# Patient Record
Sex: Male | Born: 1985 | Race: White | Hispanic: No | Marital: Single | Smoking: Current every day smoker
Health system: Southern US, Community
[De-identification: ages and names within clinical notes are randomized; demographics above are authoritative.]

## PROBLEM LIST (undated history)

## (undated) DIAGNOSIS — N200 Calculus of kidney: Secondary | ICD-10-CM

## (undated) DIAGNOSIS — F419 Anxiety disorder, unspecified: Secondary | ICD-10-CM

## (undated) DIAGNOSIS — I10 Essential (primary) hypertension: Secondary | ICD-10-CM

## (undated) DIAGNOSIS — F1010 Alcohol abuse, uncomplicated: Secondary | ICD-10-CM

## (undated) HISTORY — PX: TONSILLECTOMY: SUR1361

---
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
## 2006-02-18 ENCOUNTER — Emergency Department (HOSPITAL_COMMUNITY): Admission: EM | Admit: 2006-02-18 | Discharge: 2006-02-18 | Payer: Self-pay | Admitting: Emergency Medicine

## 2007-11-12 ENCOUNTER — Emergency Department (HOSPITAL_COMMUNITY): Admission: EM | Admit: 2007-11-12 | Discharge: 2007-11-12 | Payer: Self-pay | Admitting: Emergency Medicine

## 2013-04-30 ENCOUNTER — Emergency Department (HOSPITAL_BASED_OUTPATIENT_CLINIC_OR_DEPARTMENT_OTHER)
Admission: EM | Admit: 2013-04-30 | Discharge: 2013-04-30 | Disposition: A | Discharge status: Home / Self Care | Payer: Self-pay | Attending: Emergency Medicine | Admitting: Emergency Medicine

## 2013-04-30 ENCOUNTER — Emergency Department (HOSPITAL_BASED_OUTPATIENT_CLINIC_OR_DEPARTMENT_OTHER): Payer: Self-pay

## 2013-04-30 ENCOUNTER — Encounter (HOSPITAL_BASED_OUTPATIENT_CLINIC_OR_DEPARTMENT_OTHER): Payer: Self-pay | Admitting: Family Medicine

## 2013-04-30 DIAGNOSIS — N23 Unspecified renal colic: Secondary | ICD-10-CM | POA: Insufficient documentation

## 2013-04-30 DIAGNOSIS — R112 Nausea with vomiting, unspecified: Secondary | ICD-10-CM | POA: Insufficient documentation

## 2013-04-30 DIAGNOSIS — M549 Dorsalgia, unspecified: Secondary | ICD-10-CM | POA: Insufficient documentation

## 2013-04-30 DIAGNOSIS — N509 Disorder of male genital organs, unspecified: Secondary | ICD-10-CM | POA: Insufficient documentation

## 2013-04-30 DIAGNOSIS — F17200 Nicotine dependence, unspecified, uncomplicated: Secondary | ICD-10-CM | POA: Insufficient documentation

## 2013-04-30 LAB — CBC WITH DIFFERENTIAL/PLATELET
Band Neutrophils: 8 % (ref 0–10)
Basophils Absolute: 0 10*3/uL (ref 0.0–0.1)
Basophils Relative: 0 % (ref 0–1)
Eosinophils Absolute: 0 10*3/uL (ref 0.0–0.7)
Eosinophils Relative: 0 % (ref 0–5)
Hemoglobin: 17.9 g/dL — ABNORMAL HIGH (ref 13.0–17.0)
Lymphocytes Relative: 10 % — ABNORMAL LOW (ref 12–46)
Lymphs Abs: 2 10*3/uL (ref 0.7–4.0)
MCH: 29.6 pg (ref 26.0–34.0)
Metamyelocytes Relative: 1 %
Monocytes Absolute: 1 10*3/uL (ref 0.1–1.0)
Monocytes Relative: 5 % (ref 3–12)
Neutro Abs: 16.9 10*3/uL — ABNORMAL HIGH (ref 1.7–7.7)
RBC: 6.05 MIL/uL — ABNORMAL HIGH (ref 4.22–5.81)
RDW: 14.4 % (ref 11.5–15.5)
WBC: 19.9 10*3/uL — ABNORMAL HIGH (ref 4.0–10.5)

## 2013-04-30 LAB — URINALYSIS, ROUTINE W REFLEX MICROSCOPIC
Nitrite: NEGATIVE
Specific Gravity, Urine: 1.03 (ref 1.005–1.030)
pH: 8 (ref 5.0–8.0)

## 2013-04-30 LAB — BASIC METABOLIC PANEL
BUN: 13 mg/dL (ref 6–23)
Calcium: 10.7 mg/dL — ABNORMAL HIGH (ref 8.4–10.5)
Creatinine, Ser: 1.1 mg/dL (ref 0.50–1.35)
GFR calc non Af Amer: >90 mL/min (ref >90)
Potassium: 3.9 mEq/L (ref 3.5–5.1)

## 2013-04-30 LAB — URINE MICROSCOPIC-ADD ON

## 2013-04-30 MED ORDER — TAMSULOSIN HCL 0.4 MG PO CAPS: 0.4000 mg | ORAL_CAPSULE | Freq: Every day | ORAL | Status: DC

## 2013-04-30 MED ORDER — ONDANSETRON HCL 4 MG/2ML IJ SOLN
4.0000 mg | Freq: Once | INTRAMUSCULAR | Status: AC
  Administered 2013-04-30: 4 mg via INTRAVENOUS

## 2013-04-30 MED ORDER — KETOROLAC TROMETHAMINE 30 MG/ML IJ SOLN
30.0000 mg | Freq: Once | INTRAMUSCULAR | Status: AC
  Administered 2013-04-30: 30 mg via INTRAVENOUS

## 2013-04-30 MED ORDER — KETOROLAC TROMETHAMINE 30 MG/ML IJ SOLN
INTRAMUSCULAR | Status: AC
  Administered 2013-04-30: 30 mg via INTRAVENOUS
  Filled 2013-04-30: qty 1

## 2013-04-30 MED ORDER — MORPHINE SULFATE 4 MG/ML IJ SOLN
INTRAMUSCULAR | Status: AC
  Administered 2013-04-30: 4 mg via INTRAVENOUS
  Filled 2013-04-30: qty 1

## 2013-04-30 MED ORDER — MORPHINE SULFATE 4 MG/ML IJ SOLN
4.0000 mg | Freq: Once | INTRAMUSCULAR | Status: AC
  Administered 2013-04-30: 4 mg via INTRAVENOUS

## 2013-04-30 MED ORDER — OXYCODONE-ACETAMINOPHEN 7.5-325 MG PO TABS: 1.0000 | ORAL_TABLET | ORAL | Status: DC | PRN

## 2013-04-30 MED ORDER — MORPHINE SULFATE 4 MG/ML IJ SOLN
4.0000 mg | Freq: Once | INTRAMUSCULAR | Status: AC
  Administered 2013-04-30: 4 mg via INTRAVENOUS
  Filled 2013-04-30: qty 1

## 2013-04-30 MED ORDER — ONDANSETRON HCL 4 MG/2ML IJ SOLN
INTRAMUSCULAR | Status: AC
  Filled 2013-04-30: qty 2

## 2013-04-30 NOTE — ED Notes (Signed)
Pt does not have a PCP. 

## 2013-04-30 NOTE — ED Notes (Signed)
Pt c/o low abdominal pain and left flank pain, "scrotal area", n/v, diaphoresis.

## 2013-04-30 NOTE — ED Provider Notes (Signed)
CSN: [id number]     Arrival date & time 04/30/13  1036 History     First MD Initiated Contact with Patient 04/30/13 1103     Chief Complaint  Patient presents with  . Abdominal Pain  . Back Pain   (Consider location/radiation/quality/duration/timing/severity/associated sxs/prior Treatment) HPI Comments: Patient presents to the ER with complaints of left flank pain. Patient reports that he had onset of left-sided back pain last night that improved with massage. This morning, however, pain has become severe. Pain was only from the lower back down into the left groin area. Is expressing nausea and vomiting as well as wife secondary to pain. Patient has never had similar pain. Pain is severe and constant.  Patient is a 27 y.o. male presenting with abdominal pain and back pain.  Abdominal Pain Associated symptoms: nausea and vomiting   Associated symptoms: no hematuria   Back Pain Associated symptoms: abdominal pain     History reviewed. No pertinent past medical history. Past Surgical History  Procedure Laterality Date  . Tonsillectomy     No family history on file. History  Substance Use Topics  . Smoking status: Current Every Day Smoker  . Smokeless tobacco: Not on file  . Alcohol Use: Yes    Review of Systems  Gastrointestinal: Positive for nausea, vomiting and abdominal pain.  Genitourinary: Positive for testicular pain. Negative for hematuria, discharge and penile pain.  Musculoskeletal: Positive for back pain.  All other systems reviewed and are negative.    Allergies  Review of patient's allergies indicates no known allergies.  Home Medications  No current outpatient prescriptions on file. BP 142/70  Pulse 54  Temp(Src) 98.6 F (37 C) (Oral)  Resp 22  Ht 5\' 9"  (1.753 m)  Wt 193 lb (87.544 kg)  BMI 28.49 kg/m2  SpO2 100% Physical Exam  Constitutional: He is oriented to person, place, and time. He appears well-developed and well-nourished. He appears  distressed.  HENT:  Head: Normocephalic and atraumatic.  Right Ear: Hearing normal.  Left Ear: Hearing normal.  Nose: Nose normal.  Mouth/Throat: Oropharynx is clear and moist and mucous membranes are normal.  Eyes: Conjunctivae and EOM are normal. Pupils are equal, round, and reactive to light.  Neck: Normal range of motion. Neck supple.  Cardiovascular: Regular rhythm, S1 normal and S2 normal.  Exam reveals no gallop and no friction rub.   No murmur heard. Pulmonary/Chest: Effort normal and breath sounds normal. No respiratory distress. He exhibits no tenderness.  Abdominal: Soft. Normal appearance and bowel sounds are normal. There is no hepatosplenomegaly. There is no tenderness. There is no rebound, no guarding, no tenderness at McBurney's point and negative Murphy's sign. No hernia.  Musculoskeletal: Normal range of motion.  Neurological: He is alert and oriented to person, place, and time. He has normal strength. No cranial nerve deficit or sensory deficit. Coordination normal. GCS eye subscore is 4. GCS verbal subscore is 5. GCS motor subscore is 6.  Skin: Skin is warm, dry and intact. No rash noted. No cyanosis.  Psychiatric: He has a normal mood and affect. His speech is normal and behavior is normal. Thought content normal.    ED Course   Procedures (including critical care time)  Labs Reviewed  CBC WITH DIFFERENTIAL - Abnormal; Notable for the following:    WBC 19.9 (*)    RBC 6.05 (*)    Hemoglobin 17.9 (*)    HCT 52.4 (*)    Lymphocytes Relative 10 (*)    Neutro  Abs 16.9 (*)    All other components within normal limits  BASIC METABOLIC PANEL - Abnormal; Notable for the following:    Glucose, Bld 147 (*)    Calcium 10.7 (*)    All other components within normal limits  URINALYSIS, ROUTINE W REFLEX MICROSCOPIC   Ct Abdomen Pelvis Wo Contrast  04/30/2013   *RADIOLOGY REPORT*  Clinical Data: Left flank pain  CT ABDOMEN AND PELVIS WITHOUT CONTRAST  Technique:   Multidetector CT imaging of the abdomen and pelvis was performed following the standard protocol without intravenous contrast.  Comparison: None  Findings: Mild obstruction of the left kidney with mild hydronephrosis and mild renal enlargement.  There is a 2 mm stone in the mid-left ureter at the L3-4 level.  No other renal calculi identified.  The right kidney is normal.  Lung bases are clear.  Fatty infiltration of the liver without focal liver lesion.  Gallbladder is negative.  Pancreas and spleen are negative.  Negative for bowel obstruction or bowel thickening.  No free fluid or mass.  IMPRESSION: 2 mm partially obstructing stone mid-left ureter at the L3-4 level.   Original Report Authenticated By: Janeece Riggers, M.D.   Diagnosis: Ureterolithiasis  MDM  Patient presents with complaints of left flank pain radiating into the groin. His examination revealed severe distress, but examination of his abdomen and testicles was essentially normal. Presentation examination and raise concern for renal colic. CT scan was performed to identify a cause, including rule out diverticulitis. CAT scan that showed a partially obstructing mid ureter 2 mm to which explains the patient's symptoms. Patient given analgesia. Will followup with urology.  Gilda Crease, MD 04/30/13 [phone]

## 2013-05-12 ENCOUNTER — Emergency Department (HOSPITAL_BASED_OUTPATIENT_CLINIC_OR_DEPARTMENT_OTHER)
Admission: EM | Admit: 2013-05-12 | Discharge: 2013-05-12 | Disposition: A | Discharge status: Home / Self Care | Payer: Self-pay | Attending: Emergency Medicine | Admitting: Emergency Medicine

## 2013-05-12 ENCOUNTER — Encounter (HOSPITAL_BASED_OUTPATIENT_CLINIC_OR_DEPARTMENT_OTHER): Payer: Self-pay | Admitting: *Deleted

## 2013-05-12 DIAGNOSIS — F17200 Nicotine dependence, unspecified, uncomplicated: Secondary | ICD-10-CM | POA: Insufficient documentation

## 2013-05-12 DIAGNOSIS — N23 Unspecified renal colic: Secondary | ICD-10-CM | POA: Insufficient documentation

## 2013-05-12 DIAGNOSIS — Z87442 Personal history of urinary calculi: Secondary | ICD-10-CM | POA: Insufficient documentation

## 2013-05-12 DIAGNOSIS — R6883 Chills (without fever): Secondary | ICD-10-CM | POA: Insufficient documentation

## 2013-05-12 DIAGNOSIS — Z79899 Other long term (current) drug therapy: Secondary | ICD-10-CM | POA: Insufficient documentation

## 2013-05-12 HISTORY — DX: Calculus of kidney: N20.0

## 2013-05-12 LAB — URINE MICROSCOPIC-ADD ON

## 2013-05-12 LAB — URINALYSIS, ROUTINE W REFLEX MICROSCOPIC
Glucose, UA: NEGATIVE mg/dL
Ketones, ur: NEGATIVE mg/dL
Leukocytes, UA: NEGATIVE
Nitrite: NEGATIVE
Specific Gravity, Urine: 1.025 (ref 1.005–1.030)
Urobilinogen, UA: 0.2 mg/dL (ref 0.0–1.0)

## 2013-05-12 MED ORDER — TAMSULOSIN HCL 0.4 MG PO CAPS
0.4000 mg | ORAL_CAPSULE | ORAL | Status: AC
  Administered 2013-05-12: 0.4 mg via ORAL
  Filled 2013-05-12: qty 1

## 2013-05-12 MED ORDER — NAPROXEN SODIUM 220 MG PO TABS: ORAL_TABLET | ORAL | Status: DC

## 2013-05-12 MED ORDER — TAMSULOSIN HCL 0.4 MG PO CAPS: ORAL_CAPSULE | ORAL | Status: DC

## 2013-05-12 MED ORDER — OXYCODONE-ACETAMINOPHEN 7.5-325 MG PO TABS: 1.0000 | ORAL_TABLET | ORAL | Status: DC | PRN

## 2013-05-12 MED ORDER — KETOROLAC TROMETHAMINE 60 MG/2ML IM SOLN
60.0000 mg | Freq: Once | INTRAMUSCULAR | Status: AC
  Administered 2013-05-12: 60 mg via INTRAMUSCULAR
  Filled 2013-05-12: qty 2

## 2013-05-12 MED ORDER — TAMSULOSIN HCL 0.4 MG PO CAPS: 0.4000 mg | ORAL_CAPSULE | Freq: Every day | ORAL | Status: DC

## 2013-05-12 NOTE — ED Notes (Signed)
MD at bedside. 

## 2013-05-12 NOTE — ED Provider Notes (Signed)
CSN: [id number]     Arrival date & time 05/12/13  3086 History     First MD Initiated Contact with Patient 05/12/13 0540     Chief Complaint  Patient presents with  . Flank Pain   (Consider location/radiation/quality/duration/timing/severity/associated sxs/prior Treatment) HPI This is a 27 year old male who was seen here on the seventh of this month and diagnosed with a ureteral stone. He was advised that the stone would pass in about 2 days, and he was given prescriptions for 20 Percocets and 5 Flomax capsules. He states the pain returned to 3 days ago and became moderate to severe this morning. The pain is previously primarily located in the left flank but is now more intense in the left groin and less intense in the left flank. He's had some chills but no fever. He denies nausea or vomiting. He has not noted any hematuria but has had difficulty urinating. He is out of his medications. The pain is not worsened with movement nor relieved with rest.  Past Medical History  Diagnosis Date  . Kidney stone    Past Surgical History  Procedure Laterality Date  . Tonsillectomy     History reviewed. No pertinent family history. History  Substance Use Topics  . Smoking status: Current Every Day Smoker  . Smokeless tobacco: Not on file  . Alcohol Use: Yes    Review of Systems  All other systems reviewed and are negative.    Allergies  Review of patient's allergies indicates no known allergies.  Home Medications   Current Outpatient Rx  Name  Route  Sig  Dispense  Refill  . oxyCODONE-acetaminophen (PERCOCET) 7.5-325 MG per tablet   Oral   Take 1 tablet by mouth every 4 (four) hours as needed for pain.   20 tablet   0   . tamsulosin (FLOMAX) 0.4 MG CAPS capsule   Oral   Take 1 capsule (0.4 mg total) by mouth daily.   5 capsule   0    BP 142/72  Pulse 80  Temp(Src) 98.4 F (36.9 C) (Oral)  Resp 18  Ht 5\' 9"  (1.753 m)  Wt 200 lb (90.719 kg)  BMI 29.52 kg/m2  SpO2  99%  Physical Exam General: Well-developed, well-nourished male in no acute distress; appearance consistent with age of record HENT: normocephalic, atraumatic Eyes: pupils equal round and reactive to light; extraocular muscles intact Neck: supple Heart: regular rate and rhythm; no murmurs, rubs or gallops Lungs: clear to auscultation bilaterally Abdomen: soft; nondistended; nontender; no masses or hepatosplenomegaly; bowel sounds present GU: Mild left CVA tenderness; urine cloudy and slightly blood-tinged Extremities: No deformity; full range of motion Neurologic: Awake, alert and oriented; motor function intact in all extremities and symmetric; no facial droop Skin: Warm and dry Psychiatric: Normal mood and affect    ED Course   Procedures (including critical care time)    MDM   Nursing notes and vitals signs, including pulse oximetry, reviewed.  Summary of this visit's results, reviewed by myself:  Labs:  Results for orders placed during the hospital encounter of 05/12/13 (from the past 24 hour(s))  URINALYSIS, ROUTINE W REFLEX MICROSCOPIC     Status: Abnormal   Collection Time    05/12/13  5:44 AM      Result Value Range   Color, Urine YELLOW  YELLOW   APPearance CLOUDY (*) CLEAR   Specific Gravity, Urine 1.025  1.005 - 1.030   pH 5.5  5.0 - 8.0   Glucose, UA  NEGATIVE  NEGATIVE mg/dL   Hgb urine dipstick LARGE (*) NEGATIVE   Bilirubin Urine NEGATIVE  NEGATIVE   Ketones, ur NEGATIVE  NEGATIVE mg/dL   Protein, ur NEGATIVE  NEGATIVE mg/dL   Urobilinogen, UA 0.2  0.0 - 1.0 mg/dL   Nitrite NEGATIVE  NEGATIVE   Leukocytes, UA NEGATIVE  NEGATIVE  URINE MICROSCOPIC-ADD ON     Status: Abnormal   Collection Time    05/12/13  5:44 AM      Result Value Range   Squamous Epithelial / LPF RARE  RARE   WBC, UA 0-2  <3 WBC/hpf   RBC / HPF TOO NUMEROUS TO COUNT  <3 RBC/hpf   Bacteria, UA RARE  RARE   Crystals CA OXALATE CRYSTALS (*) NEGATIVE   Urine-Other MUCOUS PRESENT         Hanley Seamen, MD 05/12/13 [phone]

## 2013-05-12 NOTE — ED Notes (Signed)
Pt here c/o left flank pain and groin pain, dx'd with kidney stone 2 weeks ago

## 2013-09-28 ENCOUNTER — Encounter (HOSPITAL_BASED_OUTPATIENT_CLINIC_OR_DEPARTMENT_OTHER): Payer: Self-pay | Admitting: Emergency Medicine

## 2013-09-28 ENCOUNTER — Emergency Department (HOSPITAL_BASED_OUTPATIENT_CLINIC_OR_DEPARTMENT_OTHER)
Admission: EM | Admit: 2013-09-28 | Discharge: 2013-09-28 | Disposition: A | Discharge status: Home / Self Care | Payer: No Typology Code available for payment source | Attending: Emergency Medicine | Admitting: Emergency Medicine

## 2013-09-28 DIAGNOSIS — J029 Acute pharyngitis, unspecified: Secondary | ICD-10-CM | POA: Insufficient documentation

## 2013-09-28 DIAGNOSIS — F17200 Nicotine dependence, unspecified, uncomplicated: Secondary | ICD-10-CM | POA: Insufficient documentation

## 2013-09-28 DIAGNOSIS — J329 Chronic sinusitis, unspecified: Secondary | ICD-10-CM | POA: Insufficient documentation

## 2013-09-28 DIAGNOSIS — Z87442 Personal history of urinary calculi: Secondary | ICD-10-CM | POA: Insufficient documentation

## 2013-09-28 NOTE — ED Provider Notes (Signed)
CSN: [id number]     Arrival date & time 09/28/13  1401 History   First MD Initiated Contact with Patient 09/28/13 1427     Chief Complaint  Patient presents with  . URI    Patient is a 28 y.o. male presenting with URI.  URI Presenting symptoms: congestion, cough, ear pain and sore throat   Severity:  Mild Onset quality:  Gradual Duration:  3 days Timing:  Intermittent Progression:  Worsening Chronicity:  New Worsened by:  Nothing tried Associated symptoms: sinus pain     Past Medical History  Diagnosis Date  . Kidney stone    Past Surgical History  Procedure Laterality Date  . Tonsillectomy     No family history on file. History  Substance Use Topics  . Smoking status: Current Every Day Smoker -- 1.00 packs/day    Types: Cigarettes  . Smokeless tobacco: Not on file  . Alcohol Use: Yes    Review of Systems  HENT: Positive for congestion, ear pain and sore throat.   Respiratory: Positive for cough.     Allergies  Review of patient's allergies indicates no known allergies.  Home Medications  No current outpatient prescriptions on file. BP 134/89  Pulse 85  Temp(Src) 98.6 F (37 C) (Oral)  Resp 18  Ht 5\' 9"  (1.753 m)  Wt 195 lb (88.451 kg)  BMI 28.78 kg/m2  SpO2 98% Physical Exam CONSTITUTIONAL: Well developed/well nourished HEAD: Normocephalic/atraumatic EYES: EOMI/PERRL ENMT: Mucous membranes moist, uvula midline, mild erythema to oropharynx, no exudates, nasal congestion noted. Bilateral TM without erythema noted NECK: supple no meningeal signs CV: S1/S2 noted, no murmurs/rubs/gallops noted LUNGS: Lungs are clear to auscultation bilaterally, no apparent distress ABDOMEN: soft, nontender, no rebound or guarding NEURO: Pt is awake/alert, moves all extremitiesx4 EXTREMITIES: pulses normal, full ROM SKIN: warm, color normal PSYCH: no abnormalities of mood noted  ED Course  Procedures (including critical care time) Labs Review Labs Reviewed - No data  to display Imaging Review No results found.  EKG Interpretation   None       MDM   1. Sinusitis    Nursing notes including past medical history and social history reviewed and considered in documentation  Suspect viral process causing symptoms.  Stable for d/c home    Sharyon Cable, MD 09/28/13 1512

## 2013-09-28 NOTE — ED Notes (Signed)
Sneezing, stuffy nose, sore throat and ear pain.

## 2013-09-28 NOTE — Discharge Instructions (Signed)
Antibiotic Nonuse   Your caregiver felt that the infection or problem was not one that would be helped with an antibiotic.  Infections may be caused by viruses or bacteria. Only a caregiver can tell which one of these is the likely cause of an illness. A cold is the most common cause of infection in both adults and children. A cold is a virus. Antibiotic treatment will have no effect on a viral infection. Viruses can lead to many lost days of work caring for sick children and many missed days of school. Children may catch as many as 10 "colds" or "flus" per year during which they can be tearful, cranky, and uncomfortable. The goal of treating a virus is aimed at keeping the ill person comfortable.  Antibiotics are medications used to help the body fight bacterial infections. There are relatively few types of bacteria that cause infections but there are hundreds of viruses. While both viruses and bacteria cause infection they are very different types of germs. A viral infection will typically go away by itself within 7 to 10 days. Bacterial infections may spread or get worse without antibiotic treatment.  Examples of bacterial infections are:   Sore throats (like strep throat or tonsillitis).   Infection in the lung (pneumonia).   Ear and skin infections.  Examples of viral infections are:   Colds or flus.   Most coughs and bronchitis.   Sore throats not caused by Strep.   Runny noses.  It is often best not to take an antibiotic when a viral infection is the cause of the problem. Antibiotics can kill off the helpful bacteria that we have inside our body and allow harmful bacteria to start growing. Antibiotics can cause side effects such as allergies, nausea, and diarrhea without helping to improve the symptoms of the viral infection. Additionally, repeated uses of antibiotics can cause bacteria inside of our body to become resistant. That resistance can be passed onto harmful bacterial. The next time you have  an infection it may be harder to treat if antibiotics are used when they are not needed. Not treating with antibiotics allows our own immune system to develop and take care of infections more efficiently. Also, antibiotics will work better for us when they are prescribed for bacterial infections.  Treatments for a child that is ill may include:   Give extra fluids throughout the day to stay hydrated.   Get plenty of rest.   Only give your child over-the-counter or prescription medicines for pain, discomfort, or fever as directed by your caregiver.   The use of a cool mist humidifier may help stuffy noses.   Cold medications if suggested by your caregiver.  Your caregiver may decide to start you on an antibiotic if:   The problem you were seen for today continues for a longer length of time than expected.   You develop a secondary bacterial infection.  SEEK MEDICAL CARE IF:   Fever lasts longer than 5 days.   Symptoms continue to get worse after 5 to 7 days or become severe.   Difficulty in breathing develops.   Signs of dehydration develop (poor drinking, rare urinating, dark colored urine).   Changes in behavior or worsening tiredness (listlessness or lethargy).  Document Released: 11/19/2001 Document Revised: 12/03/2011 Document Reviewed: 05/18/2009  [employer] Patient Information 2014 [employer]

## 2013-12-28 ENCOUNTER — Encounter (HOSPITAL_BASED_OUTPATIENT_CLINIC_OR_DEPARTMENT_OTHER): Payer: Self-pay | Admitting: Emergency Medicine

## 2013-12-28 ENCOUNTER — Emergency Department (HOSPITAL_BASED_OUTPATIENT_CLINIC_OR_DEPARTMENT_OTHER)
Admission: EM | Admit: 2013-12-28 | Discharge: 2013-12-28 | Discharge status: Against Medical Advice | Payer: No Typology Code available for payment source | Attending: Emergency Medicine | Admitting: Emergency Medicine

## 2013-12-28 ENCOUNTER — Emergency Department (HOSPITAL_BASED_OUTPATIENT_CLINIC_OR_DEPARTMENT_OTHER): Payer: No Typology Code available for payment source

## 2013-12-28 DIAGNOSIS — X58XXXA Exposure to other specified factors, initial encounter: Secondary | ICD-10-CM | POA: Insufficient documentation

## 2013-12-28 DIAGNOSIS — F17200 Nicotine dependence, unspecified, uncomplicated: Secondary | ICD-10-CM | POA: Insufficient documentation

## 2013-12-28 DIAGNOSIS — S99919A Unspecified injury of unspecified ankle, initial encounter: Principal | ICD-10-CM

## 2013-12-28 DIAGNOSIS — Y939 Activity, unspecified: Secondary | ICD-10-CM | POA: Insufficient documentation

## 2013-12-28 DIAGNOSIS — S8990XA Unspecified injury of unspecified lower leg, initial encounter: Secondary | ICD-10-CM | POA: Insufficient documentation

## 2013-12-28 DIAGNOSIS — Y929 Unspecified place or not applicable: Secondary | ICD-10-CM | POA: Insufficient documentation

## 2013-12-28 DIAGNOSIS — S99929A Unspecified injury of unspecified foot, initial encounter: Principal | ICD-10-CM
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
## 2019-11-22 ENCOUNTER — Encounter (HOSPITAL_COMMUNITY): Payer: Self-pay | Admitting: Emergency Medicine

## 2019-11-22 ENCOUNTER — Other Ambulatory Visit: Payer: Self-pay

## 2019-11-22 ENCOUNTER — Inpatient Hospital Stay (HOSPITAL_COMMUNITY)
Admission: EM | Admit: 2019-11-22 | Discharge: 2019-11-25 | DRG: 897 | Disposition: A | Discharge status: Home / Self Care | Payer: No Typology Code available for payment source | Attending: Internal Medicine | Admitting: Internal Medicine

## 2019-11-22 ENCOUNTER — Emergency Department (HOSPITAL_COMMUNITY): Payer: No Typology Code available for payment source

## 2019-11-22 DIAGNOSIS — Z818 Family history of other mental and behavioral disorders: Secondary | ICD-10-CM

## 2019-11-22 DIAGNOSIS — Z87442 Personal history of urinary calculi: Secondary | ICD-10-CM

## 2019-11-22 DIAGNOSIS — R7401 Elevation of levels of liver transaminase levels: Secondary | ICD-10-CM | POA: Diagnosis present

## 2019-11-22 DIAGNOSIS — K0889 Other specified disorders of teeth and supporting structures: Secondary | ICD-10-CM | POA: Diagnosis present

## 2019-11-22 DIAGNOSIS — F1010 Alcohol abuse, uncomplicated: Secondary | ICD-10-CM

## 2019-11-22 DIAGNOSIS — R569 Unspecified convulsions: Secondary | ICD-10-CM

## 2019-11-22 DIAGNOSIS — F329 Major depressive disorder, single episode, unspecified: Secondary | ICD-10-CM | POA: Diagnosis present

## 2019-11-22 DIAGNOSIS — Z560 Unemployment, unspecified: Secondary | ICD-10-CM

## 2019-11-22 DIAGNOSIS — I10 Essential (primary) hypertension: Secondary | ICD-10-CM

## 2019-11-22 DIAGNOSIS — F17210 Nicotine dependence, cigarettes, uncomplicated: Secondary | ICD-10-CM | POA: Diagnosis present

## 2019-11-22 DIAGNOSIS — F10939 Alcohol use, unspecified with withdrawal, unspecified: Secondary | ICD-10-CM | POA: Diagnosis present

## 2019-11-22 DIAGNOSIS — F10230 Alcohol dependence with withdrawal, uncomplicated: Secondary | ICD-10-CM

## 2019-11-22 DIAGNOSIS — F10239 Alcohol dependence with withdrawal, unspecified: Principal | ICD-10-CM

## 2019-11-22 DIAGNOSIS — Z79899 Other long term (current) drug therapy: Secondary | ICD-10-CM

## 2019-11-22 DIAGNOSIS — Z20822 Contact with and (suspected) exposure to covid-19: Secondary | ICD-10-CM | POA: Diagnosis present

## 2019-11-22 DIAGNOSIS — F419 Anxiety disorder, unspecified: Secondary | ICD-10-CM | POA: Diagnosis present

## 2019-11-22 DIAGNOSIS — R000 Tachycardia, unspecified: Secondary | ICD-10-CM | POA: Diagnosis present

## 2019-11-22 HISTORY — DX: Unspecified convulsions: R56.9

## 2019-11-22 HISTORY — DX: Alcohol abuse, uncomplicated: F10.10

## 2019-11-22 HISTORY — DX: Essential (primary) hypertension: I10

## 2019-11-22 LAB — CBC WITH DIFFERENTIAL/PLATELET
Abs Immature Granulocytes: 0.05 10*3/uL (ref 0.00–0.07)
Basophils Absolute: 0 10*3/uL (ref 0.0–0.1)
Basophils Relative: 0 %
Eosinophils Absolute: 0.4 10*3/uL (ref 0.0–0.5)
Eosinophils Relative: 3 %
HCT: 53.3 % — ABNORMAL HIGH (ref 39.0–52.0)
Hemoglobin: 18.2 g/dL — ABNORMAL HIGH (ref 13.0–17.0)
Immature Granulocytes: 0 %
Lymphocytes Relative: 6 %
Lymphs Abs: 0.9 10*3/uL (ref 0.7–4.0)
MCH: 33 pg (ref 26.0–34.0)
MCHC: 34.1 g/dL (ref 30.0–36.0)
MCV: 96.7 fL (ref 80.0–100.0)
Monocytes Absolute: 0.8 10*3/uL (ref 0.1–1.0)
Monocytes Relative: 5 %
Neutro Abs: 12 10*3/uL — ABNORMAL HIGH (ref 1.7–7.7)
Neutrophils Relative %: 86 %
Platelets: 192 10*3/uL (ref 150–400)
RBC: 5.51 MIL/uL (ref 4.22–5.81)
RDW: 15.9 % — ABNORMAL HIGH (ref 11.5–15.5)
WBC: 14.1 10*3/uL — ABNORMAL HIGH (ref 4.0–10.5)
nRBC: 0 % (ref 0.0–0.2)

## 2019-11-22 LAB — SALICYLATE LEVEL: Salicylate Lvl: <7 mg/dL — ABNORMAL LOW (ref 7.0–30.0)

## 2019-11-22 LAB — COMPREHENSIVE METABOLIC PANEL
ALT: 68 U/L — ABNORMAL HIGH (ref 0–44)
AST: 79 U/L — ABNORMAL HIGH (ref 15–41)
Albumin: 4.8 g/dL (ref 3.5–5.0)
Alkaline Phosphatase: 78 U/L (ref 38–126)
Anion gap: 13 (ref 5–15)
BUN: 6 mg/dL (ref 6–20)
CO2: 20 mmol/L — ABNORMAL LOW (ref 22–32)
Calcium: 9.1 mg/dL (ref 8.9–10.3)
Chloride: 99 mmol/L (ref 98–111)
Creatinine, Ser: 0.85 mg/dL (ref 0.61–1.24)
GFR calc Af Amer: >60 mL/min (ref >60)
GFR calc non Af Amer: >60 mL/min (ref >60)
Glucose, Bld: 191 mg/dL — ABNORMAL HIGH (ref 70–99)
Potassium: 3.8 mmol/L (ref 3.5–5.1)
Sodium: 132 mmol/L — ABNORMAL LOW (ref 135–145)
Total Bilirubin: 1.6 mg/dL — ABNORMAL HIGH (ref 0.3–1.2)
Total Protein: 8.2 g/dL — ABNORMAL HIGH (ref 6.5–8.1)

## 2019-11-22 LAB — CBG MONITORING, ED: Glucose-Capillary: 201 mg/dL — ABNORMAL HIGH (ref 70–99)

## 2019-11-22 LAB — ACETAMINOPHEN LEVEL: Acetaminophen (Tylenol), Serum: <10 ug/mL — ABNORMAL LOW (ref 10–30)

## 2019-11-22 LAB — ETHANOL: Alcohol, Ethyl (B): <10 mg/dL (ref <10)

## 2019-11-22 MED ORDER — ENOXAPARIN SODIUM 40 MG/0.4ML ~~LOC~~ SOLN
40.0000 mg | SUBCUTANEOUS | Status: DC
  Administered 2019-11-22 – 2019-11-24 (×3): 40 mg via SUBCUTANEOUS
  Filled 2019-11-22 (×3): qty 0.4

## 2019-11-22 MED ORDER — FOLIC ACID 5 MG/ML IJ SOLN
INTRAMUSCULAR | Status: AC
  Filled 2019-11-22: qty 0.2

## 2019-11-22 MED ORDER — ONDANSETRON HCL 4 MG PO TABS: 4.0000 mg | ORAL_TABLET | Freq: Three times a day (TID) | ORAL | Status: DC | PRN

## 2019-11-22 MED ORDER — POLYETHYLENE GLYCOL 3350 17 G PO PACK: 17.0000 g | PACK | Freq: Every day | ORAL | Status: DC | PRN

## 2019-11-22 MED ORDER — LORAZEPAM 2 MG/ML IJ SOLN
0.0000 mg | Freq: Four times a day (QID) | INTRAMUSCULAR | Status: AC
  Administered 2019-11-23: 06:00:00 1 mg via INTRAVENOUS
  Filled 2019-11-22 (×2): qty 1

## 2019-11-22 MED ORDER — LORAZEPAM 2 MG/ML IJ SOLN
2.0000 mg | Freq: Once | INTRAMUSCULAR | Status: AC
  Administered 2019-11-22: 22:00:00 2 mg via INTRAVENOUS

## 2019-11-22 MED ORDER — CLONIDINE HCL 0.1 MG PO TABS
0.1000 mg | ORAL_TABLET | Freq: Two times a day (BID) | ORAL | Status: DC
  Administered 2019-11-22: 0.1 mg via ORAL
  Filled 2019-11-22 (×2): qty 1

## 2019-11-22 MED ORDER — THIAMINE HCL 100 MG/ML IJ SOLN
Freq: Once | INTRAVENOUS | Status: AC
  Filled 2019-11-22: qty 1000

## 2019-11-22 MED ORDER — SODIUM CHLORIDE 0.9% FLUSH: 3.0000 mL | INTRAVENOUS | Status: DC | PRN

## 2019-11-22 MED ORDER — LORAZEPAM 1 MG PO TABS: 0.0000 mg | ORAL_TABLET | Freq: Two times a day (BID) | ORAL | Status: DC

## 2019-11-22 MED ORDER — ZOLPIDEM TARTRATE 5 MG PO TABS: 5.0000 mg | ORAL_TABLET | Freq: Every evening | ORAL | Status: DC | PRN

## 2019-11-22 MED ORDER — LORAZEPAM 2 MG/ML IJ SOLN
INTRAMUSCULAR | Status: AC
  Filled 2019-11-22: qty 1

## 2019-11-22 MED ORDER — M.V.I. ADULT IV INJ
INJECTION | INTRAVENOUS | Status: AC
  Filled 2019-11-22: qty 10

## 2019-11-22 MED ORDER — THIAMINE HCL 100 MG/ML IJ SOLN
100.0000 mg | Freq: Every day | INTRAMUSCULAR | Status: DC
  Administered 2019-11-22: 100 mg via INTRAVENOUS
  Filled 2019-11-22 (×2): qty 2

## 2019-11-22 MED ORDER — ALUM & MAG HYDROXIDE-SIMETH 200-200-20 MG/5ML PO SUSP: 30.0000 mL | Freq: Four times a day (QID) | ORAL | Status: DC | PRN

## 2019-11-22 MED ORDER — LORAZEPAM 2 MG/ML IJ SOLN: 0.0000 mg | Freq: Two times a day (BID) | INTRAMUSCULAR | Status: DC

## 2019-11-22 MED ORDER — LORAZEPAM 2 MG/ML IJ SOLN
2.0000 mg | Freq: Once | INTRAMUSCULAR | Status: AC
  Administered 2019-11-22: 18:00:00 2 mg via INTRAVENOUS

## 2019-11-22 MED ORDER — ONDANSETRON HCL 4 MG/2ML IJ SOLN: 4.0000 mg | Freq: Four times a day (QID) | INTRAMUSCULAR | Status: DC | PRN

## 2019-11-22 MED ORDER — THIAMINE HCL 100 MG/ML IJ SOLN
INTRAMUSCULAR | Status: AC
  Filled 2019-11-22: qty 2

## 2019-11-22 MED ORDER — MAGNESIUM SULFATE 2 GM/50ML IV SOLN
2.0000 g | INTRAVENOUS | Status: AC
  Administered 2019-11-22: 2 g via INTRAVENOUS
  Filled 2019-11-22: qty 50

## 2019-11-22 MED ORDER — THIAMINE HCL 100 MG PO TABS
100.0000 mg | ORAL_TABLET | Freq: Every day | ORAL | Status: DC
  Administered 2019-11-23 – 2019-11-25 (×3): 100 mg via ORAL
  Filled 2019-11-22 (×4): qty 1

## 2019-11-22 MED ORDER — LORAZEPAM 1 MG PO TABS: 0.0000 mg | ORAL_TABLET | Freq: Four times a day (QID) | ORAL | Status: AC

## 2019-11-22 MED ORDER — SODIUM CHLORIDE 0.9% FLUSH
3.0000 mL | Freq: Two times a day (BID) | INTRAVENOUS | Status: DC
  Administered 2019-11-22 – 2019-11-24 (×3): 3 mL via INTRAVENOUS

## 2019-11-22 MED ORDER — BISACODYL 10 MG RE SUPP: 10.0000 mg | Freq: Every day | RECTAL | Status: DC | PRN

## 2019-11-22 MED ORDER — LACTATED RINGERS IV SOLN: INTRAVENOUS | Status: DC

## 2019-11-22 MED ORDER — NICOTINE 21 MG/24HR TD PT24
21.0000 mg | MEDICATED_PATCH | Freq: Every day | TRANSDERMAL | Status: DC
  Administered 2019-11-22 – 2019-11-25 (×4): 21 mg via TRANSDERMAL
  Filled 2019-11-22 (×4): qty 1

## 2019-11-22 MED ORDER — ACETAMINOPHEN 325 MG PO TABS
650.0000 mg | ORAL_TABLET | Freq: Four times a day (QID) | ORAL | Status: DC | PRN
  Administered 2019-11-22: 22:00:00 650 mg via ORAL
  Filled 2019-11-22: qty 2

## 2019-11-22 MED ORDER — IBUPROFEN 600 MG PO TABS: 600.0000 mg | ORAL_TABLET | Freq: Three times a day (TID) | ORAL | Status: DC | PRN

## 2019-11-22 MED ORDER — ACETAMINOPHEN 650 MG RE SUPP: 650.0000 mg | Freq: Four times a day (QID) | RECTAL | Status: DC | PRN

## 2019-11-22 MED ORDER — SODIUM CHLORIDE 0.9 % IV SOLN: 250.0000 mL | INTRAVENOUS | Status: DC | PRN

## 2019-11-22 MED ORDER — ESCITALOPRAM OXALATE 10 MG PO TABS
10.0000 mg | ORAL_TABLET | Freq: Every day | ORAL | Status: DC
  Administered 2019-11-23 – 2019-11-24 (×2): 10 mg via ORAL
  Filled 2019-11-22 (×2): qty 1

## 2019-11-22 NOTE — H&P (Signed)
History and Physical    Patient Demographics:    Shawn Krueger [id number] DOB: 12-16-1985 DOA: 11/22/2019  PCP: Patient, No Pcp Per  Patient coming from: Home  I have personally briefly reviewed patient's old medical records in [place]  Chief Complaint: Seizure, alcohol withdrawal   Assessment & Plan:     Assessment/Plan Principal Problem:   Alcohol withdrawal seizure with complication, with unspecified complication ([place]) Active Problems:   HTN (hypertension)   Alcohol abuse     Principal Problem: History of alcohol abuse presenting with alcohol withdrawal with seizures Significant history of alcohol use with consumption of 1/5 of liquor daily.  Patient attempted to quit over the last 1 week.  Has been having withdrawal symptoms in the form of tremors, anxiety, diaphoresis.  Noted to have one episode of seizure at home. Responded well to Ativan in the ER. -CIWA protocol with Ativan -IV fluid resuscitation -Multivitamin, thiamine, folic acid -Seizure precautions  Other Active Problems: Anxiety/depression: -Continue Lexapro  Nicotine dependence: -Continue nicotine patch  Hypertension: Blood pressure elevated on presentation, possibly secondary to alcohol withdrawal. -We will place on clonidine to help with withdrawal as well as hypertension   DVT prophylaxis: Lovenox Code Status:  Full code Family Communication: N/A  Disposition Plan: Admit as inpatient for alcohol withdrawal with alcohol withdrawal seizures, expect 2 to 3-day inpatient stay Consults called: N/A Admission status: Inpatient status    HPI:     HPI: Shawn Krueger is a 34 y.o. male with medical history significant of alcohol abuse, anxiety, hypertension who presented to the ER with a seizure.  Patient has a history of alcohol abuse and recently stopped drinking voluntarily about 1 week ago.  He was previously drinking at least 1/5 of liquor per day.  He decided to quit and over the  last few days he has just been drinking a couple beers.  He began to have increased tremors, anxiety, diaphoresis over the last couple of days.  He did actually have a beer earlier in the day but he continued to have shaking and sweating.  He was then noted to have a witnessed seizure in the bathroom.  Family reported that they found him on the floor the bathroom shaking and the seizure stopped spontaneously after a couple of minutes.  He continued to have withdrawal symptoms in the ER.  He required a couple of doses of Ativan with improvement.  No fever, chills, chest pain, shortness of breath, nausea, vomiting, abdominal pain. ED Course:  Vital Signs reviewed on presentation, significant for temperature 98.7, heart rate 109, blood pressure 154/96, saturation 96% on room air. Labs reviewed, significant for sodium 132, potassium 3.8, BUN 6, creatinine 0.8, AST 79, ALT 68, total bilirubin 1.6, WBC count 14.1, hemoglobin 18.2, hematocrit 53, platelets 192, Tylenol level is negative, salicylate level negative, glucose 191, alcohol level is negative, SARS COVID-19 screen is pending. Imaging personally Reviewed, CT of the head shows no evidence of acute intracranial injury.  CT of the cervical spine shows no acute fractures or dislocation. EKG personally reviewed, shows sinus tachycardia.  No acute ST-T changes.    Review of systems:    Review of Systems: As per HPI otherwise 10 point review of systems negative.  All other review of systems is negative except the ones noted above in the HPI.    Past Medical and Surgical History:  Reviewed by me  Past Medical History:  Diagnosis Date  . ETOH abuse   . Hypertension   .  Kidney stone     Past Surgical History:  Procedure Laterality Date  . TONSILLECTOMY       Social History:  Reviewed by me   reports that he has been smoking cigarettes. He has been smoking about 1.00 pack per day. He does not have any smokeless tobacco history on file. He  reports current alcohol use. He reports current drug use. Drug: Marijuana.  Allergies:    No Known Allergies  Family History :   History reviewed. No pertinent family history. Family history reviewed, noted as above, not pertinent to current presentation.   Home Medications:    Prior to Admission medications   Medication Sig Start Date End Date Taking? Authorizing Provider  escitalopram (LEXAPRO) 10 MG tablet Take 10 mg by mouth daily. 11/17/19  Yes [provider]    Physical Exam:    Physical Exam: Vitals:   11/22/19 1805 11/22/19 1830 11/22/19 1845 11/22/19 1900  BP: (!) 152/93 (!) 155/98  (!) 154/96  Pulse: (!) 107 (!) 102 93 97  Resp: 18 20 (!) 22 (!) 24  Temp:      TempSrc:      SpO2: 94% 98% 97% 96%  Weight:      Height:        Constitutional: NAD, calm, comfortable Vitals:   11/22/19 1805 11/22/19 1830 11/22/19 1845 11/22/19 1900  BP: (!) 152/93 (!) 155/98  (!) 154/96  Pulse: (!) 107 (!) 102 93 97  Resp: 18 20 (!) 22 (!) 24  Temp:      TempSrc:      SpO2: 94% 98% 97% 96%  Weight:      Height:       Eyes: PERRL, lids and conjunctivae normal ENMT: Mucous membranes are moist.  Large tongue bite noted on the right side of the tongue with mild clotted blood.  Posterior pharynx clear of any exudate or lesions.Normal dentition.  Neck: normal, supple, no masses, no thyromegaly Respiratory: clear to auscultation bilaterally, no wheezing, no crackles. Normal respiratory effort. No accessory muscle use.  Cardiovascular: Regular rate and rhythm, no murmurs / rubs / gallops. No extremity edema. 2+ pedal pulses. No carotid bruits.  Abdomen: no tenderness, no masses palpated. No hepatosplenomegaly. Bowel sounds positive.  Musculoskeletal: no clubbing / cyanosis. No joint deformity upper and lower extremities. Good ROM, no contractures. Normal muscle tone.  Skin: no rashes, lesions, ulcers. No induration Neurologic: CN 2-12 grossly intact. Sensation intact, DTR  normal. Strength 5/5 in all 4.  Psychiatric: Normal judgment and insight. Alert and oriented x 3. Normal mood.    Decubitus Ulcers: Not present on admission Catheters and tubes: None  Data Review:    Labs on Admission: I have personally reviewed following labs and imaging studies  CBC: Recent Labs  Lab 11/22/19 1744  WBC 14.1*  NEUTROABS 12.0*  HGB 18.2*  HCT 53.3*  MCV 96.7  PLT [id number]   Basic Metabolic Panel: Recent Labs  Lab 11/22/19 1744  NA 132*  K 3.8  CL 99  CO2 20*  GLUCOSE 191*  BUN 6  CREATININE 0.85  CALCIUM 9.1   GFR: Estimated Creatinine Clearance: 122.5 mL/min (by C-G formula based on SCr of 0.85 mg/dL). Liver Function Tests: Recent Labs  Lab 11/22/19 1744  AST 79*  ALT 68*  ALKPHOS 78  BILITOT 1.6*  PROT 8.2*  ALBUMIN 4.8   No results for input(s): LIPASE, AMYLASE in the last 168 hours. No results for input(s): AMMONIA in the last 168 hours.  Coagulation Profile: No results for input(s): INR, PROTIME in the last 168 hours. Cardiac Enzymes: No results for input(s): CKTOTAL, CKMB, CKMBINDEX, TROPONINI in the last 168 hours. BNP (last 3 results) No results for input(s): PROBNP in the last 8760 hours. HbA1C: No results for input(s): HGBA1C in the last 72 hours. CBG: Recent Labs  Lab 11/22/19 1759  GLUCAP 201*   Lipid Profile: No results for input(s): CHOL, HDL, LDLCALC, TRIG, CHOLHDL, LDLDIRECT in the last 72 hours. Thyroid Function Tests: No results for input(s): TSH, T4TOTAL, FREET4, T3FREE, THYROIDAB in the last 72 hours. Anemia Panel: No results for input(s): VITAMINB12, FOLATE, FERRITIN, TIBC, IRON, RETICCTPCT in the last 72 hours. Urine analysis:    Component Value Date/Time   COLORURINE YELLOW 05/12/2013 0544   APPEARANCEUR CLOUDY (A) 05/12/2013 0544   LABSPEC 1.025 05/12/2013 0544   PHURINE 5.5 05/12/2013 0544   GLUCOSEU NEGATIVE 05/12/2013 0544   HGBUR LARGE (A) 05/12/2013 0544   BILIRUBINUR NEGATIVE 05/12/2013 0544    [place] 05/12/2013 0544   PROTEINUR NEGATIVE 05/12/2013 0544   UROBILINOGEN 0.2 05/12/2013 0544   NITRITE NEGATIVE 05/12/2013 0544   LEUKOCYTESUR NEGATIVE 05/12/2013 0544     Imaging Results:      Radiological Exams on Admission: CT Head Wo Contrast  Result Date: 11/22/2019 CLINICAL DATA:  Possible seizure. Woke up on bathroom floor without a recollection of preceding events. EXAM: CT HEAD WITHOUT CONTRAST CT CERVICAL SPINE WITHOUT CONTRAST TECHNIQUE: Multidetector CT imaging of the head and cervical spine was performed following the standard protocol without intravenous contrast. Multiplanar CT image reconstructions of the cervical spine were also generated. COMPARISON:  None. FINDINGS: CT HEAD FINDINGS Brain: There is no evidence of acute infarct, intracranial hemorrhage, mass, midline shift, or extra-axial fluid collection. The ventricles and sulci are normal. Vascular: No hyperdense vessel. Skull: No fracture or suspicious osseous lesion. Sinuses/Orbits: Visualized paranasal sinuses are clear. Small left mastoid effusion. Unremarkable orbits. Other: None. CT CERVICAL SPINE FINDINGS Alignment: Slight reversal of the normal cervical lordosis. No listhesis. Skull base and vertebrae: No acute fracture or suspicious osseous lesion. Soft tissues and spinal canal: No prevertebral fluid or swelling. No visible canal hematoma. Disc levels:  Unremarkable. Upper chest: Clear lung apices. Other: None. IMPRESSION: No evidence of acute intracranial or cervical spine injury. Electronically Signed   By: Logan Bores M.D.   On: 11/22/2019 19:00   CT Cervical Spine Wo Contrast  Result Date: 11/22/2019 CLINICAL DATA:  Possible seizure. Woke up on bathroom floor without a recollection of preceding events. EXAM: CT HEAD WITHOUT CONTRAST CT CERVICAL SPINE WITHOUT CONTRAST TECHNIQUE: Multidetector CT imaging of the head and cervical spine was performed following the standard protocol without intravenous  contrast. Multiplanar CT image reconstructions of the cervical spine were also generated. COMPARISON:  None. FINDINGS: CT HEAD FINDINGS Brain: There is no evidence of acute infarct, intracranial hemorrhage, mass, midline shift, or extra-axial fluid collection. The ventricles and sulci are normal. Vascular: No hyperdense vessel. Skull: No fracture or suspicious osseous lesion. Sinuses/Orbits: Visualized paranasal sinuses are clear. Small left mastoid effusion. Unremarkable orbits. Other: None. CT CERVICAL SPINE FINDINGS Alignment: Slight reversal of the normal cervical lordosis. No listhesis. Skull base and vertebrae: No acute fracture or suspicious osseous lesion. Soft tissues and spinal canal: No prevertebral fluid or swelling. No visible canal hematoma. Disc levels:  Unremarkable. Upper chest: Clear lung apices. Other: None. IMPRESSION: No evidence of acute intracranial or cervical spine injury. Electronically Signed   By: Seymour Bars.D.  On: 11/22/2019 19:00      Lynetta Mare MD [facility]  If 7PM-7AM, please contact night-coverage   11/22/2019, 7:09 PM

## 2019-11-22 NOTE — ED Provider Notes (Signed)
[facility] EMERGENCY DEPARTMENT Provider Note   CSN: [id number] Arrival date & time: 11/22/19  1715     History Chief Complaint  Patient presents with  . Seizures    Shawn Krueger is a 34 y.o. male.  HPI   This patient is a 34 year old male with a history of alcohol abuse, hypertension and a recent diagnosis of anxiety for which she was prescribed escitalopram.  The patient reports that he was recently diagnosed with anxiety, he stopped drinking approximately 1 week ago where he had previously been taking an approximately 1/5 of liquor per day and started to drink just a couple of beers but noticed he was becoming more shaky.  He did actually have a beer earlier in the day but his shaking and sweating continued.  He went into the bathroom and the next thing he knows he was on the bed in his bedroom.  A family member reported that they found him on the floor of the bathroom shaking, the seizure stopped spontaneously and they put him on the bed.  He came around and complained only of ongoing shaking, a mild headache and having some tongue pain.  He had no medication prior to arrival, no history of seizures, no history of previous head injury concussion or traumatic brain injury.  The patient denies any new medications other than Lexapro which she has taken for several days  Past Medical History:  Diagnosis Date  . ETOH abuse   . Hypertension   . Kidney stone     There are no problems to display for this patient.   Past Surgical History:  Procedure Laterality Date  . TONSILLECTOMY         History reviewed. No pertinent family history.  Social History   Tobacco Use  . Smoking status: Current Every Day Smoker    Packs/day: 1.00    Types: Cigarettes  Substance Use Topics  . Alcohol use: Yes    Comment: daily fifth  . Drug use: Yes    Types: Marijuana    Comment: xanax    Home Medications Prior to Admission medications   Medication Sig Start Date End Date Taking?  Authorizing Provider  escitalopram (LEXAPRO) 10 MG tablet Take 10 mg by mouth daily. 11/17/19  Yes [provider]    Allergies    Patient has no known allergies.  Review of Systems   Review of Systems  All other systems reviewed and are negative.   Physical Exam Updated Vital Signs BP (!) 155/98   Pulse 93   Temp 98.7 F (37.1 C) (Oral)   Resp (!) 22   Ht 1.753 m (5\' 9" )   Wt 83.9 kg   SpO2 97%   BMI 27.32 kg/m   Physical Exam Vitals and nursing note reviewed.  Constitutional:      General: He is in acute distress.     Appearance: He is well-developed. He is ill-appearing and diaphoretic.  HENT:     Head: Normocephalic and atraumatic.     Mouth/Throat:     Mouth: Mucous membranes are moist.     Pharynx: No oropharyngeal exudate.     Comments: Superficial lacerations noted to the tongue consistent with tongue biting, no dental injury, no malocclusion, no trismus or torticollis Eyes:     General: No scleral icterus.       Right eye: No discharge.        Left eye: No discharge.     Conjunctiva/sclera: Conjunctivae normal.  Pupils: Pupils are equal, round, and reactive to light.  Neck:     Thyroid: No thyromegaly.     Vascular: No JVD.     Comments: No tenderness over the posterior cervical spine Cardiovascular:     Rate and Rhythm: Regular rhythm. Tachycardia present.     Heart sounds: Normal heart sounds. No murmur. No friction rub. No gallop.      Comments: Tachycardic to 115 bpm Pulmonary:     Effort: Pulmonary effort is normal. No respiratory distress.     Breath sounds: Normal breath sounds. No wheezing or rales.  Abdominal:     General: Bowel sounds are normal. There is no distension.     Palpations: Abdomen is soft. There is no mass.     Tenderness: There is no abdominal tenderness.  Musculoskeletal:        General: No tenderness. Normal range of motion.     Cervical back: Normal range of motion and neck supple.  Lymphadenopathy:      Cervical: No cervical adenopathy.  Skin:    General: Skin is warm.     Findings: No erythema or rash.  Neurological:     Mental Status: He is alert.     Coordination: Coordination normal.     Comments: The patient has a diffuse tremor and some asterixis of his hands.  He is able to speak in full sentences with normal memory except for the event surrounding the seizure.  He follows commands without any difficulty, he can perform finger-nose-finger with a tremor but is able to perform it, normal grips, normal strength, he is alert  Psychiatric:        Behavior: Behavior normal.     ED Results / Procedures / Treatments   Labs (all labs ordered are listed, but only abnormal results are displayed) Labs Reviewed  CBC WITH DIFFERENTIAL/PLATELET - Abnormal; Notable for the following components:      Result Value   WBC 14.1 (*)    Hemoglobin 18.2 (*)    HCT 53.3 (*)    RDW 15.9 (*)    Neutro Abs 12.0 (*)    All other components within normal limits  COMPREHENSIVE METABOLIC PANEL - Abnormal; Notable for the following components:   Sodium 132 (*)    CO2 20 (*)    Glucose, Bld 191 (*)    Total Protein 8.2 (*)    AST 79 (*)    ALT 68 (*)    Total Bilirubin 1.6 (*)    All other components within normal limits  ACETAMINOPHEN LEVEL - Abnormal; Notable for the following components:   Acetaminophen (Tylenol), Serum <10 (*)    All other components within normal limits  SALICYLATE LEVEL - Abnormal; Notable for the following components:   Salicylate Lvl [id number] (*)    All other components within normal limits  CBG MONITORING, ED - Abnormal; Notable for the following components:   Glucose-Capillary 201 (*)    All other components within normal limits  SARS CORONAVIRUS 2 (TAT 6-24 HRS)  ETHANOL    EKG EKG Interpretation  Date/Time:  Sunday November 22 2019 17:24:24 EST Ventricular Rate:  107 PR Interval:    QRS Duration: 79 QT Interval:  321 QTC Calculation: 429 R Axis:   47 Text  Interpretation: Sinus tachycardia Probable left atrial enlargement ST elev, probable normal early repol pattern No old tracing to compare Confirmed by Noemi Chapel [phone]) on 11/22/2019 5:33:35 PM   Radiology CT Head Wo Contrast  Result Date:  11/22/2019 CLINICAL DATA:  Possible seizure. Woke up on bathroom floor without a recollection of preceding events. EXAM: CT HEAD WITHOUT CONTRAST CT CERVICAL SPINE WITHOUT CONTRAST TECHNIQUE: Multidetector CT imaging of the head and cervical spine was performed following the standard protocol without intravenous contrast. Multiplanar CT image reconstructions of the cervical spine were also generated. COMPARISON:  None. FINDINGS: CT HEAD FINDINGS Brain: There is no evidence of acute infarct, intracranial hemorrhage, mass, midline shift, or extra-axial fluid collection. The ventricles and sulci are normal. Vascular: No hyperdense vessel. Skull: No fracture or suspicious osseous lesion. Sinuses/Orbits: Visualized paranasal sinuses are clear. Small left mastoid effusion. Unremarkable orbits. Other: None. CT CERVICAL SPINE FINDINGS Alignment: Slight reversal of the normal cervical lordosis. No listhesis. Skull base and vertebrae: No acute fracture or suspicious osseous lesion. Soft tissues and spinal canal: No prevertebral fluid or swelling. No visible canal hematoma. Disc levels:  Unremarkable. Upper chest: Clear lung apices. Other: None. IMPRESSION: No evidence of acute intracranial or cervical spine injury. Electronically Signed   By: Logan Bores M.D.   On: 11/22/2019 19:00   CT Cervical Spine Wo Contrast  Result Date: 11/22/2019 CLINICAL DATA:  Possible seizure. Woke up on bathroom floor without a recollection of preceding events. EXAM: CT HEAD WITHOUT CONTRAST CT CERVICAL SPINE WITHOUT CONTRAST TECHNIQUE: Multidetector CT imaging of the head and cervical spine was performed following the standard protocol without intravenous contrast. Multiplanar CT image  reconstructions of the cervical spine were also generated. COMPARISON:  None. FINDINGS: CT HEAD FINDINGS Brain: There is no evidence of acute infarct, intracranial hemorrhage, mass, midline shift, or extra-axial fluid collection. The ventricles and sulci are normal. Vascular: No hyperdense vessel. Skull: No fracture or suspicious osseous lesion. Sinuses/Orbits: Visualized paranasal sinuses are clear. Small left mastoid effusion. Unremarkable orbits. Other: None. CT CERVICAL SPINE FINDINGS Alignment: Slight reversal of the normal cervical lordosis. No listhesis. Skull base and vertebrae: No acute fracture or suspicious osseous lesion. Soft tissues and spinal canal: No prevertebral fluid or swelling. No visible canal hematoma. Disc levels:  Unremarkable. Upper chest: Clear lung apices. Other: None. IMPRESSION: No evidence of acute intracranial or cervical spine injury. Electronically Signed   By: Logan Bores M.D.   On: 11/22/2019 19:00    Procedures .Critical Care Performed by: Noemi Chapel, MD Authorized by: Noemi Chapel, MD   Critical care provider statement:    Critical care time (minutes):  35   Critical care time was exclusive of:  Separately billable procedures and treating other patients and teaching time   Critical care was necessary to treat or prevent imminent or life-threatening deterioration of the following conditions:  CNS failure or compromise and metabolic crisis   Critical care was time spent personally by me on the following activities:  Blood draw for specimens, development of treatment plan with patient or surrogate, discussions with consultants, evaluation of patient's response to treatment, examination of patient, obtaining history from patient or surrogate, ordering and performing treatments and interventions, ordering and review of laboratory studies, ordering and review of radiographic studies, pulse oximetry, re-evaluation of patient's condition and review of old charts    (including critical care time)  Medications Ordered in ED Medications  sodium chloride 0.9 % 1,000 mL with thiamine 123XX123 mg, folic acid 1 mg, multivitamins adult 10 mL infusion (has no administration in time range)  LORazepam (ATIVAN) injection 0-4 mg (0 mg Intravenous Hold 11/22/19 1759)    Or  LORazepam (ATIVAN) tablet 0-4 mg ( Oral See Alternative 11/22/19 1759)  LORazepam (ATIVAN) injection 0-4 mg (has no administration in time range)    Or  LORazepam (ATIVAN) tablet 0-4 mg (has no administration in time range)  thiamine tablet 100 mg ( Oral See Alternative 11/22/19 1758)    Or  thiamine (B-1) injection 100 mg (100 mg Intravenous Given 11/22/19 1758)  ibuprofen (ADVIL) tablet 600 mg (has no administration in time range)  zolpidem (AMBIEN) tablet 5 mg (has no administration in time range)  ondansetron (ZOFRAN) tablet 4 mg (has no administration in time range)  alum & mag hydroxide-simeth (MAALOX/MYLANTA) 200-200-20 MG/5ML suspension 30 mL (has no administration in time range)  nicotine (NICODERM CQ - dosed in mg/24 hours) patch 21 mg (21 mg Transdermal Patch Applied 11/22/19 1757)  LORazepam (ATIVAN) injection 2 mg (has no administration in time range)  LORazepam (ATIVAN) injection 2 mg (2 mg Intravenous Given 11/22/19 1742)  magnesium sulfate IVPB 2 g 50 mL (2 g Intravenous New Bag/Given 11/22/19 1758)    ED Course  I have reviewed the triage vital signs and the nursing notes.  Pertinent labs & imaging results that were available during my care of the patient were reviewed by me and considered in my medical decision making (see chart for details).    MDM Rules/Calculators/A&P                      This patient is definitely ill from having alcohol withdrawal seizures.  He appears to need intravenous benzodiazepines as he is acutely in withdrawal and definitely a danger for recurrent seizures.  He will need a CT scan of the head and the cervical spine to rule out injury from the fall, lab  work including electrolytes, coingestants, alcohol level as well as an EKG.  Thankfully the lacerations in the time are superficial and likely do not need to be repaired.  I have encouraged him to take a clear liquid diet.  Seizure pads will be placed on the bed  I personally evaluated and interpreted the EKG and find there to be sinus tachycardia with some early repolarization but no other signs of arrhythmia.  Seizure precautions, frequent neurologic evaluations, this patient is critically ill with alcohol withdrawal seizures   Multiple doses of Ativan  Banana bag  Cardiac monitoring  Consultation with hospitalist for admission  No further seizures   D/w hosptialist - Dr. Scherrie November - will admit   Final Clinical Impression(s) / ED Diagnoses Final diagnoses:  Alcohol withdrawal seizure without complication [facility])    Rx / DC Orders ED Discharge Orders    None       Noemi Chapel, MD 11/22/19 1909

## 2019-11-22 NOTE — ED Triage Notes (Signed)
Pt states he has been trying to stop drinking and has not had a drink in about a week. Prior to this was drinking about a fifth of liquor daily. Pt states he thinks he had a seizure at home today, fell in the bathroom and does not remember it.

## 2019-11-23 ENCOUNTER — Encounter (HOSPITAL_COMMUNITY): Payer: Self-pay | Admitting: Internal Medicine

## 2019-11-23 LAB — COMPREHENSIVE METABOLIC PANEL
ALT: 55 U/L — ABNORMAL HIGH (ref 0–44)
AST: 82 U/L — ABNORMAL HIGH (ref 15–41)
Albumin: 4 g/dL (ref 3.5–5.0)
Alkaline Phosphatase: 60 U/L (ref 38–126)
Anion gap: 10 (ref 5–15)
BUN: 9 mg/dL (ref 6–20)
CO2: 22 mmol/L (ref 22–32)
Calcium: 8.5 mg/dL — ABNORMAL LOW (ref 8.9–10.3)
Chloride: 102 mmol/L (ref 98–111)
Creatinine, Ser: 0.72 mg/dL (ref 0.61–1.24)
GFR calc Af Amer: >60 mL/min (ref >60)
GFR calc non Af Amer: >60 mL/min (ref >60)
Glucose, Bld: 102 mg/dL — ABNORMAL HIGH (ref 70–99)
Potassium: 3.7 mmol/L (ref 3.5–5.1)
Sodium: 134 mmol/L — ABNORMAL LOW (ref 135–145)
Total Bilirubin: 1.9 mg/dL — ABNORMAL HIGH (ref 0.3–1.2)
Total Protein: 6.9 g/dL (ref 6.5–8.1)

## 2019-11-23 LAB — CBC
HCT: 48.9 % (ref 39.0–52.0)
Hemoglobin: 16.3 g/dL (ref 13.0–17.0)
MCH: 33.1 pg (ref 26.0–34.0)
MCHC: 33.3 g/dL (ref 30.0–36.0)
MCV: 99.4 fL (ref 80.0–100.0)
Platelets: 130 10*3/uL — ABNORMAL LOW (ref 150–400)
RBC: 4.92 MIL/uL (ref 4.22–5.81)
RDW: 16.3 % — ABNORMAL HIGH (ref 11.5–15.5)
WBC: 10.9 10*3/uL — ABNORMAL HIGH (ref 4.0–10.5)
nRBC: 0 % (ref 0.0–0.2)

## 2019-11-23 LAB — SARS CORONAVIRUS 2 (TAT 6-24 HRS): SARS Coronavirus 2: NEGATIVE

## 2019-11-23 LAB — MAGNESIUM: Magnesium: 2.3 mg/dL (ref 1.7–2.4)

## 2019-11-23 LAB — PHOSPHORUS: Phosphorus: 1.4 mg/dL — ABNORMAL LOW (ref 2.5–4.6)

## 2019-11-23 LAB — HIV ANTIBODY (ROUTINE TESTING W REFLEX): HIV Screen 4th Generation wRfx: NONREACTIVE

## 2019-11-23 MED ORDER — AMLODIPINE BESYLATE 5 MG PO TABS
10.0000 mg | ORAL_TABLET | Freq: Every day | ORAL | Status: DC
  Administered 2019-11-23 – 2019-11-25 (×3): 10 mg via ORAL
  Filled 2019-11-23 (×3): qty 2

## 2019-11-23 MED ORDER — PHENOL 1.4 % MT LIQD
1.0000 | OROMUCOSAL | Status: DC | PRN
  Administered 2019-11-23: 04:00:00 1 via OROMUCOSAL
  Filled 2019-11-23: qty 177

## 2019-11-23 MED ORDER — LORAZEPAM 2 MG/ML IJ SOLN
2.0000 mg | Freq: Once | INTRAMUSCULAR | Status: AC
  Administered 2019-11-23: 10:00:00 2 mg via INTRAVENOUS
  Filled 2019-11-23: qty 1

## 2019-11-23 MED ORDER — KETOROLAC TROMETHAMINE 15 MG/ML IJ SOLN
15.0000 mg | Freq: Three times a day (TID) | INTRAMUSCULAR | Status: DC | PRN
  Administered 2019-11-23 – 2019-11-24 (×3): 15 mg via INTRAVENOUS
  Filled 2019-11-23 (×2): qty 1

## 2019-11-23 MED ORDER — BUSPIRONE HCL 5 MG PO TABS
10.0000 mg | ORAL_TABLET | Freq: Three times a day (TID) | ORAL | Status: DC
  Administered 2019-11-23 – 2019-11-25 (×5): 10 mg via ORAL
  Filled 2019-11-23 (×5): qty 2

## 2019-11-23 MED ORDER — LIDOCAINE VISCOUS HCL 2 % MT SOLN
15.0000 mL | OROMUCOSAL | Status: DC | PRN
  Administered 2019-11-23 – 2019-11-24 (×3): 15 mL via OROMUCOSAL
  Filled 2019-11-23 (×5): qty 15

## 2019-11-23 NOTE — Progress Notes (Addendum)
Patient Demographics:    Shawn Krueger, is a 34 y.o. male, DOB - 1986-06-16, [id number]  Admit date - 11/22/2019   Admitting Physician Lynetta Mare, MD  Outpatient Primary MD for the patient is Patient, No Pcp Per  LOS - 1   Chief Complaint  Patient presents with  . Seizures        Subjective:    Shawn Krueger today has no fevers, no emesis,  No chest pain,  -Remains anxious, cooperative however -Complains of oral/dental pain and discomfort  Assessment  & Plan :    Principal Problem:   Alcohol withdrawal seizure with complication, with unspecified complication ([place]) Active Problems:   HTN (hypertension)   Alcohol abuse   Alcohol withdrawal ([place])  Brief Summary:-  34 y.o. male with medical history significant of alcohol abuse, anxiety, hypertension admitted on 11/22/2019 with alcohol withdrawal seizures 1 week after he quit drinking   A/p History of alcohol abuse presenting with alcohol withdrawal with seizures Significant history of alcohol use with consumption of 2/5 of liquor daily.   -Admitted with severe alcohol withdrawal symptoms, had seizures with tongue biting on oral injuries-- -continue CIWA protocol with Ativan -IV fluid resuscitation -Multivitamin, thiamine, folic acid -Continue seizure precautions -Patient declines referral to alcohol rehab programs at this time -While patient does not appear to have severe DT symptoms at this time  -We will be generous with lorazepam due to increased risk of seizures   Other Active Problems: Anxiety/depression: -Continue Lexapro and lorazepam as above #1  Nicotine dependence: -Continue nicotine patch  Hypertension: -BP remains elevated start amlodipine  Disposition/Need for in-Hospital Stay- patient unable to be discharged at this time due to --- alcohol withdrawal with seizures requiring IV lorazepam -He remains At risk  for further seizures, not medically ready for discharge home  Code Status : Full code  Family Communication:  (patient is alert, awake and coherent) --Discussed with Mother   Shawn Krueger  Consults  :  na  DVT Prophylaxis  :  Lovenox  - SCDs  Lab Results  Component Value Date   PLT 130 (L) 11/23/2019    Inpatient Medications  Scheduled Meds: . cloNIDine  0.1 mg Oral BID  . enoxaparin (LOVENOX) injection  40 mg Subcutaneous Q24H  . escitalopram  10 mg Oral Daily  . LORazepam  0-4 mg Intravenous Q6H   Or  . LORazepam  0-4 mg Oral Q6H  . [START ON 11/25/2019] LORazepam  0-4 mg Intravenous Q12H   Or  . [START ON 11/25/2019] LORazepam  0-4 mg Oral Q12H  . LORazepam  2 mg Intravenous Once  . nicotine  21 mg Transdermal Daily  . sodium chloride flush  3 mL Intravenous Q12H  . thiamine  100 mg Oral Daily   Or  . thiamine  100 mg Intravenous Daily   Continuous Infusions: . sodium chloride    . lactated ringers 125 mL/hr at 11/23/19 0100   PRN Meds:.sodium chloride, acetaminophen **OR** acetaminophen, alum & mag hydroxide-simeth, bisacodyl, ketorolac, lidocaine, ondansetron **OR** ondansetron (ZOFRAN) IV, phenol, polyethylene glycol, sodium chloride flush, zolpidem    Anti-infectives (From admission, onward)   None        Objective:   Vitals:   11/23/19 0217 11/23/19 0414  11/23/19 0500 11/23/19 0532  BP: (!) 152/96 (!) 155/92  (!) 156/98  Pulse: 86 90  86  Resp: (!) 24 19  20   Temp: 98.2 F (36.8 C) 98.6 F (37 C)    TempSrc: Oral Oral    SpO2: 99% 100%  98%  Weight:   97.3 kg   Height:        Wt Readings from Last 3 Encounters:  11/23/19 97.3 kg  12/28/13 86.2 kg  09/28/13 88.5 kg     Intake/Output Summary (Last 24 hours) at 11/23/2019 [id number] Last data filed at 11/23/2019 0300 Gross per 24 hour  Intake 369.9 ml  Output --  Net 369.9 ml   Physical Exam  Gen:- Awake Alert, no acute distress HEENT:- [place].AT, No sclera icterus Mouth--tongue and oral  injury/bite marks, mostly hemostatic Neck-Supple Neck,No JVD,.  Lungs-  CTAB , fair symmetrical air movement CV- S1, S2 normal, regular  Abd-  +ve B.Sounds, Abd Soft, No tenderness,    Extremity/Skin:- No  edema, pedal pulses present  Psych-affect is anxious,, oriented x3 Neuro-no new focal deficits, +ve tremors   Data Review:   Micro Results No results found for this or any previous visit (from the past 240 hour(s)).  Radiology Reports CT Head Wo Contrast  Result Date: 11/22/2019 CLINICAL DATA:  Possible seizure. Woke up on bathroom floor without a recollection of preceding events. EXAM: CT HEAD WITHOUT CONTRAST CT CERVICAL SPINE WITHOUT CONTRAST TECHNIQUE: Multidetector CT imaging of the head and cervical spine was performed following the standard protocol without intravenous contrast. Multiplanar CT image reconstructions of the cervical spine were also generated. COMPARISON:  None. FINDINGS: CT HEAD FINDINGS Brain: There is no evidence of acute infarct, intracranial hemorrhage, mass, midline shift, or extra-axial fluid collection. The ventricles and sulci are normal. Vascular: No hyperdense vessel. Skull: No fracture or suspicious osseous lesion. Sinuses/Orbits: Visualized paranasal sinuses are clear. Small left mastoid effusion. Unremarkable orbits. Other: None. CT CERVICAL SPINE FINDINGS Alignment: Slight reversal of the normal cervical lordosis. No listhesis. Skull base and vertebrae: No acute fracture or suspicious osseous lesion. Soft tissues and spinal canal: No prevertebral fluid or swelling. No visible canal hematoma. Disc levels:  Unremarkable. Upper chest: Clear lung apices. Other: None. IMPRESSION: No evidence of acute intracranial or cervical spine injury. Electronically Signed   By: Logan Bores M.D.   On: 11/22/2019 19:00   CT Cervical Spine Wo Contrast  Result Date: 11/22/2019 CLINICAL DATA:  Possible seizure. Woke up on bathroom floor without a recollection of preceding  events. EXAM: CT HEAD WITHOUT CONTRAST CT CERVICAL SPINE WITHOUT CONTRAST TECHNIQUE: Multidetector CT imaging of the head and cervical spine was performed following the standard protocol without intravenous contrast. Multiplanar CT image reconstructions of the cervical spine were also generated. COMPARISON:  None. FINDINGS: CT HEAD FINDINGS Brain: There is no evidence of acute infarct, intracranial hemorrhage, mass, midline shift, or extra-axial fluid collection. The ventricles and sulci are normal. Vascular: No hyperdense vessel. Skull: No fracture or suspicious osseous lesion. Sinuses/Orbits: Visualized paranasal sinuses are clear. Small left mastoid effusion. Unremarkable orbits. Other: None. CT CERVICAL SPINE FINDINGS Alignment: Slight reversal of the normal cervical lordosis. No listhesis. Skull base and vertebrae: No acute fracture or suspicious osseous lesion. Soft tissues and spinal canal: No prevertebral fluid or swelling. No visible canal hematoma. Disc levels:  Unremarkable. Upper chest: Clear lung apices. Other: None. IMPRESSION: No evidence of acute intracranial or cervical spine injury. Electronically Signed   By: Seymour Bars.D.  On: 11/22/2019 19:00     CBC Recent Labs  Lab 11/22/19 1744 11/23/19 0438  WBC 14.1* 10.9*  HGB 18.2* 16.3  HCT 53.3* 48.9  PLT 192 130*  MCV 96.7 99.4  MCH 33.0 33.1  MCHC 34.1 33.3  RDW 15.9* 16.3*  LYMPHSABS 0.9  --   MONOABS 0.8  --   EOSABS 0.4  --   BASOSABS 0.0  --     Chemistries  Recent Labs  Lab 11/22/19 1744 11/23/19 0438  NA 132* 134*  K 3.8 3.7  CL 99 102  CO2 20* 22  GLUCOSE 191* 102*  BUN 6 9  CREATININE 0.85 0.72  CALCIUM 9.1 8.5*  MG  --  2.3  AST 79* 82*  ALT 68* 55*  ALKPHOS 78 60  BILITOT 1.6* 1.9*   ------------------------------------------------------------------------------------------------------------------ No results for input(s): CHOL, HDL, LDLCALC, TRIG, CHOLHDL, LDLDIRECT in the last 72 hours.  No  results found for: HGBA1C ------------------------------------------------------------------------------------------------------------------ No results for input(s): TSH, T4TOTAL, T3FREE, THYROIDAB in the last 72 hours.  Invalid input(s): FREET3 ------------------------------------------------------------------------------------------------------------------ No results for input(s): VITAMINB12, FOLATE, FERRITIN, TIBC, IRON, RETICCTPCT in the last 72 hours.  Coagulation profile No results for input(s): INR, PROTIME in the last 168 hours.  No results for input(s): DDIMER in the last 72 hours.  Cardiac Enzymes No results for input(s): CKMB, TROPONINI, MYOGLOBIN in the last 168 hours.  Invalid input(s): CK ------------------------------------------------------------------------------------------------------------------ No results found for: BNP   Roxan Hockey M.D on 11/23/2019 at 9:27 AM  Go to [email or web address] - for contact info  [facility] - Office  [phone]

## 2019-11-23 NOTE — Progress Notes (Signed)
MD notified of pt complaint of pain 4/10 when not talking and 10/10 when he talks or drinks. Pt stated the pain is in his R ear, throat and tongue. MD ordered prn medication (see mar) Pt resting in bed, call bell in reach, seizure pads in place, and bed in low position.

## 2019-11-24 MED ORDER — ESCITALOPRAM OXALATE 10 MG PO TABS
20.0000 mg | ORAL_TABLET | Freq: Every day | ORAL | Status: DC
  Administered 2019-11-25: 10:00:00 20 mg via ORAL
  Filled 2019-11-24: qty 2

## 2019-11-24 MED ORDER — DIAZEPAM 5 MG PO TABS
5.0000 mg | ORAL_TABLET | Freq: Three times a day (TID) | ORAL | Status: DC
  Administered 2019-11-24 – 2019-11-25 (×4): 5 mg via ORAL
  Filled 2019-11-24 (×4): qty 1

## 2019-11-24 MED ORDER — FOLIC ACID 1 MG PO TABS
1.0000 mg | ORAL_TABLET | Freq: Every day | ORAL | Status: DC
  Administered 2019-11-24 – 2019-11-25 (×2): 1 mg via ORAL
  Filled 2019-11-24: qty 1

## 2019-11-24 MED ORDER — ESCITALOPRAM OXALATE 10 MG PO TABS: 10.0000 mg | ORAL_TABLET | Freq: Every day | ORAL | Status: DC

## 2019-11-24 MED ORDER — DEXTROSE-NACL 5-0.45 % IV SOLN: INTRAVENOUS | Status: DC

## 2019-11-24 MED ORDER — ESCITALOPRAM OXALATE 10 MG PO TABS
10.0000 mg | ORAL_TABLET | Freq: Once | ORAL | Status: AC
  Administered 2019-11-24: 16:00:00 10 mg via ORAL
  Filled 2019-11-24: qty 1

## 2019-11-24 NOTE — TOC Initial Note (Signed)
Transition of Care [facility]) - Initial/Assessment Note    Patient Details  Name: Shawn Krueger MRN: [id number] Date of Birth: 1985-12-07  Transition of [facility]) CM/SW Contact:    Shawn Mage, LCSW Phone Number: 11/24/2019, 2:48 PM  Clinical Narrative:   Met with patient here for alcohol withdrawal/seizure per Dr request. Mr Shawn Krueger was willing to talk openly about his alcohol use.  He states he only picked up drinking several years ago, and at one point had put toghether 6 months sobriety.  "I did it on sheer will power I guess.  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 We called his mother together, and she expressed appreciation for the resources, and inquired about the possibility of patient transferring to [facility] for further initial treatment related to both the alcohol and mental health issues. Patient agreed he was open to this.  Made Dr aware of this request. TOC will continue to follow during the course of hospitalization.              Expected Discharge Plan: Home/Self Care Barriers to Discharge: No Barriers Identified   Patient Goals and CMS Choice Patient states their goals for this hospitalization and ongoing recovery are:: "I'm open to any help you can suggest."      Expected Discharge Plan and Services Expected Discharge Plan: Home/Self Care In-house Referral: Clinical Social Work     Living arrangements for the past 2 months: Single Family Home                                      Prior Living  Arrangements/Services Living arrangements for the past 2 months: Single Family Home Lives with:: Parents Patient language and need for interpreter reviewed:: Yes Do you feel safe going back to the place where you live?: Yes      Need for Family Participation in Patient Care: No (Comment) Care giver support system in place?: Yes (comment)   Criminal Activity/Legal Involvement Pertinent to Current Situation/Hospitalization: No - Comment as needed  Activities of Daily Living Home Assistive Devices/Equipment: None ADL Screening (condition at time of admission) Patient's cognitive ability adequate to safely complete daily activities?: Yes Is the patient deaf or have difficulty hearing?: No Does the patient have difficulty seeing, even when wearing glasses/contacts?: No Does the patient have difficulty concentrating, remembering, or making decisions?: No Patient able to express need for assistance with ADLs?: Yes Does the patient have difficulty dressing or bathing?: No Independently performs ADLs?: Yes (appropriate for developmental age) Does the patient have difficulty walking or climbing stairs?: No Weakness of Legs: Both Weakness of Arms/Hands: None  Permission Sought/Granted Permission sought to share information with : Family Supports Permission granted to share information with : Yes, Verbal Permission Granted  Share Information with NAME: Mother           Emotional Assessment Appearance:: Appears stated age Attitude/Demeanor/Rapport: Engaged Affect (typically observed): Appropriate Orientation: :  Oriented to Self, Oriented to Place, Oriented to  Time, Oriented to Situation Alcohol / Substance Use: Alcohol Use Psych Involvement: No (comment)  Admission diagnosis:  Alcohol withdrawal ([place]) [F10.239] Alcohol withdrawal seizure without complication ([place]) [G92.119, R56.9] Patient Active Problem List   Diagnosis Date Noted  . HTN (hypertension) 11/22/2019  . Alcohol abuse  11/22/2019  . Alcohol withdrawal seizure with complication, with unspecified complication ([place]) 41/74/0814  . Alcohol withdrawal ([place]) 11/22/2019   PCP:  Patient, No Pcp Per Pharmacy:   [id number] [place] [place] [id number]: [phone]: [phone]    Social Determinants of Health (SDOH) Interventions    Readmission Risk Interventions No flowsheet data found.

## 2019-11-24 NOTE — BH Assessment (Addendum)
Tele Assessment Note   Patient Name: Shawn Krueger MRN: [id number] Referring Physician: Noemi Chapel, MD Location of Patient: [facility] Location of Provider: [place] Department  Shawn Krueger is a single 34 y.o. male who presented voluntarily to [facility] 2 days ago for tx of suspected alcohol-withdrawal related seizure. Pt states he thinks he had a seizure & fell in the bathroom. Pt states he does not remember the episode. For assessment, pt was accompanied by mother. He reports symptoms of depression and anxiety. Pt has no history of mental health or substance abuse tx. Pt reports medication, Lexapro, is prescribed by his GP. Pt  denies current suicidal ideation. He denies past suicide attempts. Pt acknowledges multiple symptoms of Depression, including anhedonia, isolating, feelings of worthlessness & guilt, changes in sleep & appetite, & increased irritability. Pt denies homicidal ideation/ history of violence. Pt denies auditory & visual hallucinations & other symptoms of psychosis. Pt states current stressors include recently losing his job of 15 years & moving (in with his mother).   Pt lives with mother, and supports include "everyone", many supportive family members. Pt denies hx of abuse. Pt reports there is heavy family history of substance abuse. There is anxiety and depression in family hx as well.  Pt has good insight and judgment. Pt's memory is  Intact. Legal history includes no charges.  Protective factors against suicide include good family support, no current suicidal ideation, no access to firearms, no current psychotic symptoms and no prior attempts.?  Pt has no IP or OP tx history. Pt reports drinking a fifth of vodka daily for about 3 years. He tried using heroin for the 1st time a week ago while drinking. Pt overdosed and needed to be revived.  ? MSE: Pt is dressed in gown, alert, oriented x4 with normal speech and normal motor behavior. Eye contact is good. Pt's  mood is pleasant and depressed and affect is constricted. Affect is congruent with mood. Thought process is coherent and relevant. There is no indication pt is currently responding to internal stimuli or experiencing delusional thought content. Pt was cooperative throughout assessment.    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 ETOH abuse   . Hypertension   . Kidney stone     Past Surgical History:  Procedure Laterality Date  . TONSILLECTOMY      Family History: History reviewed. No pertinent family history.  Social History:  reports that he has been smoking cigarettes. He has been smoking about 1.00 pack per day. He does not have any smokeless tobacco history on file. He reports current alcohol use. He reports current drug use. Drug: Marijuana.  Additional Social History:  Alcohol / Drug Use Pain Medications: See MAR Prescriptions: See MAR- Lexapro x 6 months- "doesn't really work" Over the [employer]: See MAR History of alcohol / drug use?: Yes Longest period of sobriety (when/how long): 6 months Negative Consequences of Use: Personal relationships, Work / Paediatric nurse) Substance #1 Name of Substance 1: alcohol/ vodka 1 - Age of First Use: 15 1 - Amount (size/oz): 1/5th 1 - Frequency: daiiy 1 - Duration: 3 years 1 - Last Use / Amount: 4 days Substance #2 Name of Substance 2: heroin 2 - Age of First Use: 33 2 - Frequency: once  CIWA: CIWA-Ar BP: (!) 150/97 Pulse Rate: 86 Nausea and Vomiting: no nausea  and no vomiting Tactile Disturbances: none Tremor: no tremor Auditory Disturbances: not present Paroxysmal Sweats: no sweat visible Visual Disturbances: not present Anxiety: no anxiety, at ease Headache, Fullness in Head: none present Agitation: normal activity Orientation and  Clouding of Sensorium: oriented and can do serial additions CIWA-Ar Total: 0 COWS:    Allergies: No Known Allergies  Home Medications:  Medications Prior to Admission  Medication Sig Dispense Refill  . escitalopram (LEXAPRO) 10 MG tablet Take 10 mg by mouth daily.      OB/GYN Status:  No LMP for male patient.  General Assessment Data Location of Assessment: [facility] Assessment Services TTS Assessment: In system Is this a Tele or Face-to-Face Assessment?: Tele Assessment Is this an Initial Assessment or a Re-assessment for this encounter?: Initial Assessment Patient Accompanied by:: Parent Language Other than English: No Living Arrangements: Other (Comment) What gender do you identify as?: Male Marital status: Single Pregnancy Status: No Living Arrangements: Parent(newly moved in with mother) Can pt return to current living arrangement?: Yes Admission Status: Voluntary Is patient capable of signing voluntary admission?: Yes Referral Source: Self/Family/Friend Insurance type: None     Crisis Care Plan Living Arrangements: Parent(newly moved in with mother) Name of Psychiatrist: none Name of Therapist: none  Education Status Is patient currently in school?: No Is the patient employed, unemployed or receiving disability?: Unemployed(recently let go)  Risk to self with the past 6 months Suicidal Ideation: No Has patient been a risk to self within the past 6 months prior to admission? : No Suicidal Intent: No Has patient had any suicidal intent within the past 6 months prior to admission? : No Is patient at risk for suicide?: Yes Suicidal Plan?: No Has patient had any suicidal plan within the past 6 months prior to admission? : No What has been your use of drugs/alcohol within the last 12 months?: daily use until week ago Previous Attempts/Gestures: No How many times?: 0 Other Self Harm Risks: male, substance abuse, depression dx,  recent loss of job Intentional Self  Injurious Behavior: None Family Suicide History: No Recent stressful life event(s): Financial Problems(job loss; move) Persecutory voices/beliefs?: No Depression: Yes Depression Symptoms: Despondent, Insomnia, Fatigue, Guilt, Loss of interest in usual pleasures, Feeling worthless/self pity, Feeling angry/irritable Substance abuse history and/or treatment for substance abuse?: Yes Suicide prevention information given to non-admitted patients: Not applicable  Risk to Others within the past 6 months Homicidal Ideation: No Does patient have any lifetime risk of violence toward others beyond the six months prior to admission? : No Thoughts of Harm to Others: No Current Homicidal Intent: No Current Homicidal Plan: No Access to Homicidal Means: No History of harm to others?: No Assessment of Violence: None Noted Does patient have access to weapons?: No Criminal Charges Pending?: No Does patient have a court date: No Is patient on probation?: No  Psychosis Hallucinations: None noted Delusions: None noted  Mental Status Report Appearance/Hygiene: In hospital gown, Unremarkable Eye Contact: Good Motor Activity: Freedom of movement Speech: Logical/coherent Level of Consciousness: Alert Mood: Pleasant Affect: Constricted Anxiety Level: Minimal Thought Processes: Coherent, Relevant Judgement: Unimpaired Orientation: Appropriate for developmental age Obsessive Compulsive Thoughts/Behaviors: None  Cognitive Functioning Concentration: Normal Memory: Recent Intact, Remote Intact Is patient IDD: No Insight: Good Impulse Control: Good Sleep: Decreased Total Hours of Sleep: 2  ADLScreening [facility] Assessment Services) Patient's cognitive ability adequate to safely complete daily activities?: Yes Patient able to express need for assistance with ADLs?: Yes Independently performs ADLs?: Yes (appropriate for developmental age)  Prior Inpatient Therapy Prior Inpatient Therapy: No  Prior  Outpatient Therapy Prior Outpatient Therapy: No Does patient have an ACCT team?: No Does patient have Intensive In-House Services?  : No Does patient have [facility] services? : No Does patient have P4CC services?: No  ADL Screening (condition at time of admission) Patient's cognitive ability adequate to safely complete daily activities?: Yes Is the patient deaf or have difficulty hearing?: No Does the patient have difficulty seeing, even when wearing glasses/contacts?: No Does the patient have difficulty concentrating, remembering, or making decisions?: No Patient able to express need for assistance with ADLs?: Yes Does the patient have difficulty dressing or bathing?: No Independently performs ADLs?: Yes (appropriate for developmental age) Does the patient have difficulty walking or climbing stairs?: No Weakness of Legs: None Weakness of Arms/Hands: None  Home Assistive Devices/Equipment Home Assistive Devices/Equipment: None  Therapy Consults (therapy consults require a physician order) PT Evaluation Needed: No OT Evalulation Needed: No SLP Evaluation Needed: No Abuse/Neglect Assessment (Assessment to be complete while patient is alone) Abuse/Neglect Assessment Can Be Completed: Yes Physical Abuse: Denies Verbal Abuse: Denies Sexual Abuse: Denies Exploitation of patient/patient's resources: Denies Self-Neglect: Denies Values / Beliefs Cultural Requests During Hospitalization: None Spiritual Requests During Hospitalization: None Consults Spiritual Care Consult Needed: No Transition of Care Team Consult Needed: No Advance Directives (For Healthcare) Does Patient Have a Medical Advance Directive?: No Would patient like information on creating a medical advance directive?: No - Patient declined Nutrition Screen- [facility] Adult/WL/AP Patient's home diet: Regular Has the patient recently lost weight without trying?: No Has the patient been eating poorly because of a decreased  appetite?: Yes Malnutrition Screening Tool Score: 1        Disposition:  Mordecai Maes, NP recommends outpt substance abuse and MH counseling  Disposition Initial Assessment Completed for this Encounter: Yes  This service was provided via telemedicine using a 2-way, interactive audio and video technology.   Brandolyn Shortridge Tora Perches 11/24/2019 3:57 PM

## 2019-11-24 NOTE — Progress Notes (Signed)
Patient Demographics:    Shawn Krueger, is a 34 y.o. male, DOB - Aug 11, 1986, [id number]  Admit date - 11/22/2019   Admitting Physician Lynetta Mare, MD  Outpatient Primary MD for the patient is Patient, No Pcp Per  LOS - 2   Chief Complaint  Patient presents with  . Seizures        Subjective:    Shawn Krueger today has no fevers, no emesis,  No chest pain,  Eating ok C/o tongue and oral pain--   Assessment  & Plan :    Principal Problem:   Alcohol withdrawal seizure with complication, with unspecified complication ([place]) Active Problems:   HTN (hypertension)   Alcohol abuse   Alcohol withdrawal ([place])  Brief Summary:-  34 y.o. male with medical history significant of alcohol abuse, anxiety, hypertension admitted on 11/22/2019 with alcohol withdrawal seizures 1 week after he quit drinking -Psychiatric consult requested   A/p History of alcohol abuse presenting with alcohol withdrawal with seizures Significant history of alcohol use with consumption of 2/5 of liquor daily.   -Admitted with severe alcohol withdrawal symptoms, had seizures with tongue biting on oral injuries-- -continue CIWA protocol with Ativan -IV fluid resuscitation -Multivitamin, thiamine, folic acid -Continue seizure precautions -Patient declines referral to alcohol rehab programs at this time -While patient does not appear to have severe DT symptoms at this time  -We will be generous with benzos due to increased risk of seizures  -???  May meet criteria for inpatient treatment--alcohol abuse, now having alcohol withdrawal with seizures -Behavioral health/psychiatric consult requested  Other Active Problems: Anxiety/depression: -Lexapro was increased to 20 mg daily, BuSpar 10 mg 3 times daily added, continue benzos --Behavioral health/psychiatric consult requested  Nicotine dependence: -Continue nicotine  patch  Hypertension: -Stable on amlodipine  Disposition/Need for in-Hospital Stay- patient unable to be discharged at this time due to --- alcohol withdrawal with seizures requiring IV lorazepam -He remains At risk for further seizures, not medically ready for discharge home --Behavioral health/psychiatric consult requested, query if patient needs inpatient treatment  Code Status : Full code  Family Communication:  (patient is alert, awake and coherent) --Discussed with Mother   [name] by phone on 11/23/2019 and again on 11/23/2020  Consults  :  -Behavioral health/psychiatric consult requested   DVT Prophylaxis  :  Lovenox  - SCDs  Lab Results  Component Value Date   PLT 130 (L) 11/23/2019    Inpatient Medications  Scheduled Meds: . amLODipine  10 mg Oral Daily  . busPIRone  10 mg Oral TID  . diazepam  5 mg Oral TID  . enoxaparin (LOVENOX) injection  40 mg Subcutaneous Q24H  . escitalopram  10 mg Oral Daily  . folic acid  1 mg Oral Daily  . LORazepam  0-4 mg Intravenous Q6H   Or  . LORazepam  0-4 mg Oral Q6H  . [START ON 11/25/2019] LORazepam  0-4 mg Intravenous Q12H   Or  . [START ON 11/25/2019] LORazepam  0-4 mg Oral Q12H  . nicotine  21 mg Transdermal Daily  . sodium chloride flush  3 mL Intravenous Q12H  . thiamine  100 mg Oral Daily   Or  . thiamine  100 mg Intravenous Daily  Continuous Infusions: . sodium chloride    . dextrose 5 % and 0.45% NaCl     PRN Meds:.sodium chloride, acetaminophen **OR** acetaminophen, alum & mag hydroxide-simeth, bisacodyl, ketorolac, lidocaine, ondansetron **OR** ondansetron (ZOFRAN) IV, phenol, polyethylene glycol, sodium chloride flush, zolpidem    Anti-infectives (From admission, onward)   None        Objective:   Vitals:   11/23/19 2100 11/24/19 0445 11/24/19 0447 11/24/19 1045  BP: (!) 142/89 (!) 147/90    Pulse: 85 85  86  Resp: 20 18    Temp: 98.8 F (37.1 C) 99.1 F (37.3 C)    TempSrc: Oral Oral    SpO2:  100% 98%    Weight:   98.8 kg   Height:        Wt Readings from Last 3 Encounters:  11/24/19 98.8 kg  12/28/13 86.2 kg  09/28/13 88.5 kg     Intake/Output Summary (Last 24 hours) at 11/24/2019 1457 Last data filed at 11/24/2019 0300 Gross per 24 hour  Intake 2274.1 ml  Output --  Net 2274.1 ml   Physical Exam  Gen:- Awake Alert, no acute distress HEENT:- [place].AT, No sclera icterus Mouth--tongue and oral injury/bite marks, mostly hemostatic Neck-Supple Neck,No JVD,.  Lungs-  CTAB , fair symmetrical air movement CV- S1, S2 normal, regular  Abd-  +ve B.Sounds, Abd Soft, No tenderness,    Extremity/Skin:- No  edema, pedal pulses present  Psych-affect is anxious,, oriented x3 Neuro-no new focal deficits, +ve tremors   Data Review:   Micro Results Recent Results (from the past 240 hour(s))  SARS CORONAVIRUS 2 (TAT 6-24 HRS) Nasopharyngeal Nasopharyngeal Swab     Status: None   Collection Time: 11/22/19  5:44 PM   Specimen: Nasopharyngeal Swab  Result Value Ref Range Status   SARS Coronavirus 2 NEGATIVE NEGATIVE Final    Comment: (NOTE) SARS-CoV-2 target nucleic acids are NOT DETECTED. The SARS-CoV-2 RNA is generally detectable in upper and lower respiratory specimens during the acute phase of infection. Negative results do not preclude SARS-CoV-2 infection, do not rule out co-infections with other pathogens, and should not be used as the sole basis for treatment or other patient management decisions. Negative results must be combined with clinical observations, patient history, and epidemiological information. The expected result is Negative. Fact Sheet for Patients: SugarRoll.be Fact Sheet for Healthcare Providers: https://www.woods-mathews.com/ This test is not yet approved or cleared by the [place] FDA and  has been authorized for detection and/or diagnosis of SARS-CoV-2 by FDA under an Emergency Use Authorization (EUA).  This EUA will remain  in effect (meaning this test can be used) for the duration of the COVID-19 declaration under Section 56 4(b)(1) of the Act, 21 U.S.C. section 360bbb-3(b)(1), unless the authorization is terminated or revoked sooner. Performed at [facility], [place] [address], [place], [place] [address]     Radiology Reports CT Head Wo Contrast  Result Date: 11/22/2019 CLINICAL DATA:  Possible seizure. Woke up on bathroom floor without a recollection of preceding events. EXAM: CT HEAD WITHOUT CONTRAST CT CERVICAL SPINE WITHOUT CONTRAST TECHNIQUE: Multidetector CT imaging of the head and cervical spine was performed following the standard protocol without intravenous contrast. Multiplanar CT image reconstructions of the cervical spine were also generated. COMPARISON:  None. FINDINGS: CT HEAD FINDINGS Brain: There is no evidence of acute infarct, intracranial hemorrhage, mass, midline shift, or extra-axial fluid collection. The ventricles and sulci are normal. Vascular: No hyperdense vessel. Skull: No fracture or suspicious  osseous lesion. Sinuses/Orbits: Visualized paranasal sinuses are clear. Small left mastoid effusion. Unremarkable orbits. Other: None. CT CERVICAL SPINE FINDINGS Alignment: Slight reversal of the normal cervical lordosis. No listhesis. Skull base and vertebrae: No acute fracture or suspicious osseous lesion. Soft tissues and spinal canal: No prevertebral fluid or swelling. No visible canal hematoma. Disc levels:  Unremarkable. Upper chest: Clear lung apices. Other: None. IMPRESSION: No evidence of acute intracranial or cervical spine injury. Electronically Signed   By: Logan Bores M.D.   On: 11/22/2019 19:00   CT Cervical Spine Wo Contrast  Result Date: 11/22/2019 CLINICAL DATA:  Possible seizure. Woke up on bathroom floor without a recollection of preceding events. EXAM: CT HEAD WITHOUT CONTRAST CT CERVICAL SPINE WITHOUT CONTRAST TECHNIQUE: Multidetector CT imaging of  the head and cervical spine was performed following the standard protocol without intravenous contrast. Multiplanar CT image reconstructions of the cervical spine were also generated. COMPARISON:  None. FINDINGS: CT HEAD FINDINGS Brain: There is no evidence of acute infarct, intracranial hemorrhage, mass, midline shift, or extra-axial fluid collection. The ventricles and sulci are normal. Vascular: No hyperdense vessel. Skull: No fracture or suspicious osseous lesion. Sinuses/Orbits: Visualized paranasal sinuses are clear. Small left mastoid effusion. Unremarkable orbits. Other: None. CT CERVICAL SPINE FINDINGS Alignment: Slight reversal of the normal cervical lordosis. No listhesis. Skull base and vertebrae: No acute fracture or suspicious osseous lesion. Soft tissues and spinal canal: No prevertebral fluid or swelling. No visible canal hematoma. Disc levels:  Unremarkable. Upper chest: Clear lung apices. Other: None. IMPRESSION: No evidence of acute intracranial or cervical spine injury. Electronically Signed   By: Logan Bores M.D.   On: 11/22/2019 19:00     CBC Recent Labs  Lab 11/22/19 1744 11/23/19 0438  WBC 14.1* 10.9*  HGB 18.2* 16.3  HCT 53.3* 48.9  PLT 192 130*  MCV 96.7 99.4  MCH 33.0 33.1  MCHC 34.1 33.3  RDW 15.9* 16.3*  LYMPHSABS 0.9  --   MONOABS 0.8  --   EOSABS 0.4  --   BASOSABS 0.0  --     Chemistries  Recent Labs  Lab 11/22/19 1744 11/23/19 0438  NA 132* 134*  K 3.8 3.7  CL 99 102  CO2 20* 22  GLUCOSE 191* 102*  BUN 6 9  CREATININE 0.85 0.72  CALCIUM 9.1 8.5*  MG  --  2.3  AST 79* 82*  ALT 68* 55*  ALKPHOS 78 60  BILITOT 1.6* 1.9*   ------------------------------------------------------------------------------------------------------------------ No results for input(s): CHOL, HDL, LDLCALC, TRIG, CHOLHDL, LDLDIRECT in the last 72 hours.  No results found for:  HGBA1C ------------------------------------------------------------------------------------------------------------------ No results for input(s): TSH, T4TOTAL, T3FREE, THYROIDAB in the last 72 hours.  Invalid input(s): FREET3 ------------------------------------------------------------------------------------------------------------------ No results for input(s): VITAMINB12, FOLATE, FERRITIN, TIBC, IRON, RETICCTPCT in the last 72 hours.  Coagulation profile No results for input(s): INR, PROTIME in the last 168 hours.  No results for input(s): DDIMER in the last 72 hours.  Cardiac Enzymes No results for input(s): CKMB, TROPONINI, MYOGLOBIN in the last 168 hours.  Invalid input(s): CK ------------------------------------------------------------------------------------------------------------------ No results found for: BNP   Roxan Hockey M.D on 11/24/2019 at 2:57 PM  Go to [email or web address] - for contact info  [facility] - Office  [phone]

## 2019-11-25 ENCOUNTER — Encounter (HOSPITAL_COMMUNITY): Payer: Self-pay | Admitting: Internal Medicine

## 2019-11-25 DIAGNOSIS — F329 Major depressive disorder, single episode, unspecified: Secondary | ICD-10-CM

## 2019-11-25 DIAGNOSIS — F1010 Alcohol abuse, uncomplicated: Secondary | ICD-10-CM

## 2019-11-25 DIAGNOSIS — I10 Essential (primary) hypertension: Secondary | ICD-10-CM

## 2019-11-25 DIAGNOSIS — R945 Abnormal results of liver function studies: Secondary | ICD-10-CM

## 2019-11-25 LAB — COMPREHENSIVE METABOLIC PANEL
ALT: 76 U/L — ABNORMAL HIGH (ref 0–44)
AST: 196 U/L — ABNORMAL HIGH (ref 15–41)
Albumin: 3.9 g/dL (ref 3.5–5.0)
Alkaline Phosphatase: 62 U/L (ref 38–126)
Anion gap: 8 (ref 5–15)
BUN: 6 mg/dL (ref 6–20)
CO2: 23 mmol/L (ref 22–32)
Calcium: 8.4 mg/dL — ABNORMAL LOW (ref 8.9–10.3)
Chloride: 101 mmol/L (ref 98–111)
Creatinine, Ser: 0.65 mg/dL (ref 0.61–1.24)
GFR calc Af Amer: >60 mL/min (ref >60)
GFR calc non Af Amer: >60 mL/min (ref >60)
Glucose, Bld: 108 mg/dL — ABNORMAL HIGH (ref 70–99)
Potassium: 3.5 mmol/L (ref 3.5–5.1)
Sodium: 132 mmol/L — ABNORMAL LOW (ref 135–145)
Total Bilirubin: 1.3 mg/dL — ABNORMAL HIGH (ref 0.3–1.2)
Total Protein: 7.3 g/dL (ref 6.5–8.1)

## 2019-11-25 MED ORDER — BUSPIRONE HCL 7.5 MG PO TABS: 7.5000 mg | ORAL_TABLET | Freq: Three times a day (TID) | ORAL | 0 refills | Status: DC

## 2019-11-25 MED ORDER — NICOTINE 21 MG/24HR TD PT24: 21.0000 mg | MEDICATED_PATCH | Freq: Every day | TRANSDERMAL | 0 refills | Status: DC

## 2019-11-25 MED ORDER — AMLODIPINE BESYLATE 10 MG PO TABS: 10.0000 mg | ORAL_TABLET | Freq: Every day | ORAL | 0 refills | Status: DC

## 2019-11-25 MED ORDER — ESCITALOPRAM OXALATE 20 MG PO TABS: 20.0000 mg | ORAL_TABLET | Freq: Every day | ORAL | 1 refills | Status: DC

## 2019-11-25 MED ORDER — MULTI-VITAMIN/MINERALS PO TABS: 1.0000 | ORAL_TABLET | Freq: Every day | ORAL | 1 refills | Status: DC

## 2019-11-25 NOTE — Discharge Summary (Addendum)
Physician Discharge Summary  MEKAI KORINEK [id number] DOB: May 31, 1986 DOA: 11/22/2019  PCP: Patient, No Pcp Per  Admit date: 11/22/2019 Discharge date: 11/25/2019  Time spent:  35 minutes  Recommendations for Outpatient Follow-up:  1. Repeat complete metabolic panel to follow electrolytes, renal function and LFTs. 2. Reassess blood pressure and adjust antihypertensive regimen as needed. 3. Make sure patient has follow-up with outpatient psychiatrist and further assist as needed with alcohol/tobacco cessation.   Discharge Diagnoses:  Principal Problem:   Alcohol withdrawal seizure with complication, with unspecified complication ([place]) Active Problems:   HTN (hypertension)   Alcohol abuse   Alcohol withdrawal ([place]) Transaminitis  Tobacco abuse Anxiety/depression   Discharge Condition: Stable and improved.  Discharged home with instruction to follow-up with psychiatry service as an outpatient and to establish care with PCP.  CODE STATUS: Full code  Diet recommendation: Heart healthy diet.  Filed Weights   11/23/19 0500 11/24/19 0447 11/25/19 0500  Weight: 97.3 kg 98.8 kg 98.1 kg    History of present illness:  As per H&P written by Dr. Scherrie November on 11/22/19 34 y.o. male with medical history significant of alcohol abuse, anxiety, hypertension who presented to the ER with a seizure.  Patient has a history of alcohol abuse and recently stopped drinking voluntarily about 1 week ago.  He was previously drinking at least 1/5 of liquor per day.  He decided to quit and over the last few days he has just been drinking a couple beers.  He began to have increased tremors, anxiety, diaphoresis over the last couple of days.  He did actually have a beer earlier in the day but he continued to have shaking and sweating.  He was then noted to have a witnessed seizure in the bathroom.  Family reported that they found him on the floor the bathroom shaking and the seizure stopped spontaneously after a  couple of minutes.  He continued to have withdrawal symptoms in the ER.  He required a couple of doses of Ativan with improvement.  No fever, chills, chest pain, shortness of breath, nausea, vomiting, abdominal pain.  ED Course:  Vital Signs reviewed on presentation, significant for temperature 98.7, heart rate 109, blood pressure 154/96, saturation 96% on room air. Labs reviewed, significant for sodium 132, potassium 3.8, BUN 6, creatinine 0.8, AST 79, ALT 68, total bilirubin 1.6, WBC count 14.1, hemoglobin 18.2, hematocrit 53, platelets 192, Tylenol level is negative, salicylate level negative, glucose 191, alcohol level is negative, SARS COVID-19 screen is pending. Imaging personally Reviewed, CT of the head shows no evidence of acute intracranial injury.  CT of the cervical spine shows no acute fractures or dislocation. EKG personally reviewed, shows sinus tachycardia.  No acute ST-T changes.  Hospital Course:  1-alcohol abuse with alcohol withdrawal seizures on presentation -Patient treated based on CIWA protocol for 36 hours -At discharge no further seizure activity appreciated or withdrawal symptoms -Encouraged to stop drinking alcohol -Outpatient resources provided by social worker to assist with quitting process. -Patient evaluated by psychiatry and found clear and not in need of inpatient treatment. -Advised to keep himself well-hydrated and to take a multivitamin on daily basis to continue providing supplementation of folic acid and thiamine.  2-anxiety/depression -Follow recommendation by psychiatry service Lexapro dose has been adjusted and patient started on BuSpar -His mood is a stable, no suicidal ideation or hallucinations currently. -Continue outpatient follow-up as recommended.  3-HTN -continue the use of amlodipine -Patient advised to follow heart healthy diet.  4-tobacco abuse -  Cessation counseling has been provided -Patient discharged on nicotine  patch.  5-transaminitis  -in the setting of alcohol abuse -no signs of acute failure -advise to keep complete alcohol abstinence  -repeat CMET at follow up visit to assess trend.  Procedures:  See below for x-ray reports.  Consultations:  Psychiatry service.  Discharge Exam: Vitals:   11/24/19 2157 11/25/19 0623  BP: (!) 152/91 (!) 149/86  Pulse: 67 73  Resp: 17 17  Temp: 99.3 F (37.4 C) 98.7 F (37.1 C)  SpO2: 100% 99%    General: Afebrile, no chest pain, no nausea, no vomiting.  No further seizure activity appreciated.  Oriented x3.  Still reporting some discomfort with certain foods, secondary to tongue injury after seizure activity prior to admission. Cardiovascular: S1 and S2, no rubs, no gallops, no murmurs.  No JVD on exam. Respiratory: Clear to auscultation bilaterally; normal respiratory effort.  Good oxygen saturation on room air. Abdomen: Soft, nontender, distended, positive bowel sounds Extremities: No cyanosis, no clubbing, no edema.  Discharge Instructions   Discharge Instructions    Diet - low sodium heart healthy   Complete by: As directed    Discharge instructions   Complete by: As directed    Stop smoking and stop alcohol consumption. -Maintain adequate hydration and follow heart healthy diet Follow-up with PCP in 10 days Pursued outpatient resources for further assistance/help with behavioral management and alcoholism (places and resources provided by Education officer, museum while inpatient).   Increase activity slowly   Complete by: As directed      Allergies as of 11/25/2019   No Known Allergies     Medication List    TAKE these medications   amLODipine 10 MG tablet Commonly known as: NORVASC Take 1 tablet (10 mg total) by mouth daily. Start taking on: November 26, 2019   busPIRone 7.5 MG tablet Commonly known as: BUSPAR Take 1 tablet (7.5 mg total) by mouth 3 (three) times daily.   escitalopram 20 MG tablet Commonly known as: LEXAPRO Take 1  tablet (20 mg total) by mouth daily. Start taking on: November 26, 2019 What changed:   medication strength  how much to take   multivitamin with minerals tablet Take 1 tablet by mouth daily.   nicotine 21 mg/24hr patch Commonly known as: NICODERM CQ - dosed in mg/24 hours Place 1 patch (21 mg total) onto the skin daily. Start taking on: November 26, 2019      No Known Allergies Follow-up Information    Services, [facility] Follow up.   Why: This is mental health services of [facility].  They have psychiatrists for medication management and therapists for individual and group therapy to address depression and anxiety issues Contact information: [place] [address] [phone]        [facility] Follow up.   Why: They have a Substance Abuse Intensive Outpatient Program that meets several times a week.  Call them for a screening Contact information: They are the same number as [facility].  Choose option 4 and then ask for [facility].          The results of significant diagnostics from this hospitalization (including imaging, microbiology, ancillary and laboratory) are listed below for reference.    Significant Diagnostic Studies: CT Head Wo Contrast  Result Date: 11/22/2019 CLINICAL DATA:  Possible seizure. Woke up on bathroom floor without a recollection of preceding events. EXAM: CT HEAD WITHOUT CONTRAST CT CERVICAL SPINE WITHOUT CONTRAST TECHNIQUE: Multidetector CT imaging of the  head and cervical spine was performed following the standard protocol without intravenous contrast. Multiplanar CT image reconstructions of the cervical spine were also generated. COMPARISON:  None. FINDINGS: CT HEAD FINDINGS Brain: There is no evidence of acute infarct, intracranial hemorrhage, mass, midline shift, or extra-axial fluid collection. The ventricles and sulci are normal. Vascular: No hyperdense vessel. Skull: No fracture or suspicious osseous lesion. Sinuses/Orbits:  Visualized paranasal sinuses are clear. Small left mastoid effusion. Unremarkable orbits. Other: None. CT CERVICAL SPINE FINDINGS Alignment: Slight reversal of the normal cervical lordosis. No listhesis. Skull base and vertebrae: No acute fracture or suspicious osseous lesion. Soft tissues and spinal canal: No prevertebral fluid or swelling. No visible canal hematoma. Disc levels:  Unremarkable. Upper chest: Clear lung apices. Other: None. IMPRESSION: No evidence of acute intracranial or cervical spine injury. Electronically Signed   By: Logan Bores M.D.   On: 11/22/2019 19:00   CT Cervical Spine Wo Contrast  Result Date: 11/22/2019 CLINICAL DATA:  Possible seizure. Woke up on bathroom floor without a recollection of preceding events. EXAM: CT HEAD WITHOUT CONTRAST CT CERVICAL SPINE WITHOUT CONTRAST TECHNIQUE: Multidetector CT imaging of the head and cervical spine was performed following the standard protocol without intravenous contrast. Multiplanar CT image reconstructions of the cervical spine were also generated. COMPARISON:  None. FINDINGS: CT HEAD FINDINGS Brain: There is no evidence of acute infarct, intracranial hemorrhage, mass, midline shift, or extra-axial fluid collection. The ventricles and sulci are normal. Vascular: No hyperdense vessel. Skull: No fracture or suspicious osseous lesion. Sinuses/Orbits: Visualized paranasal sinuses are clear. Small left mastoid effusion. Unremarkable orbits. Other: None. CT CERVICAL SPINE FINDINGS Alignment: Slight reversal of the normal cervical lordosis. No listhesis. Skull base and vertebrae: No acute fracture or suspicious osseous lesion. Soft tissues and spinal canal: No prevertebral fluid or swelling. No visible canal hematoma. Disc levels:  Unremarkable. Upper chest: Clear lung apices. Other: None. IMPRESSION: No evidence of acute intracranial or cervical spine injury. Electronically Signed   By: Logan Bores M.D.   On: 11/22/2019 19:00     Microbiology: Recent Results (from the past 240 hour(s))  SARS CORONAVIRUS 2 (TAT 6-24 HRS) Nasopharyngeal Nasopharyngeal Swab     Status: None   Collection Time: 11/22/19  5:44 PM   Specimen: Nasopharyngeal Swab  Result Value Ref Range Status   SARS Coronavirus 2 NEGATIVE NEGATIVE Final    Comment: (NOTE) SARS-CoV-2 target nucleic acids are NOT DETECTED. The SARS-CoV-2 RNA is generally detectable in upper and lower respiratory specimens during the acute phase of infection. Negative results do not preclude SARS-CoV-2 infection, do not rule out co-infections with other pathogens, and should not be used as the sole basis for treatment or other patient management decisions. Negative results must be combined with clinical observations, patient history, and epidemiological information. The expected result is Negative. Fact Sheet for Patients: SugarRoll.be Fact Sheet for Healthcare Providers: https://www.woods-mathews.com/ This test is not yet approved or cleared by the [place] FDA and  has been authorized for detection and/or diagnosis of SARS-CoV-2 by FDA under an Emergency Use Authorization (EUA). This EUA will remain  in effect (meaning this test can be used) for the duration of the COVID-19 declaration under Section 56 4(b)(1) of the Act, 21 U.S.C. section 360bbb-3(b)(1), unless the authorization is terminated or revoked sooner. Performed at [facility], [place] [address] [place], [place] [address]      Labs: Basic Metabolic Panel: Recent Labs  Lab 11/22/19 1744 11/23/19 0438 11/25/19 0523  NA  132* 134* 132*  K 3.8 3.7 3.5  CL 99 102 101  CO2 20* 22 23  GLUCOSE 191* 102* 108*  BUN 6 9 6   CREATININE 0.85 0.72 0.65  CALCIUM 9.1 8.5* 8.4*  MG  --  2.3  --   PHOS  --  1.4*  --    Liver Function Tests: Recent Labs  Lab 11/22/19 1744 11/23/19 0438 11/25/19 0523  AST 79* 82* 196*  ALT 68* 55* 76*  ALKPHOS 78  60 62  BILITOT 1.6* 1.9* 1.3*  PROT 8.2* 6.9 7.3  ALBUMIN 4.8 4.0 3.9   CBC: Recent Labs  Lab 11/22/19 1744 11/23/19 0438  WBC 14.1* 10.9*  NEUTROABS 12.0*  --   HGB 18.2* 16.3  HCT 53.3* 48.9  MCV 96.7 99.4  PLT 192 130*    CBG: Recent Labs  Lab 11/22/19 1759  GLUCAP 201*   Signed:  Barton Dubois MD.  [facility] 11/25/2019, 1:33 PM

## 2019-11-25 NOTE — Consult Note (Signed)
Telepsych Consultation   Reason for Consult:  "May need criteria for inpatient treatment--alcohol abuse, now having alcohol withdrawal with seizures" Referring Physician:  Dr Dyann Kief Location of Patient: [facility] Location of Provider: [facility]  Patient Identification: Shawn Krueger MRN:  [id number] Principal Diagnosis: Alcohol withdrawal seizure with complication, with unspecified complication [place]) Diagnosis:  Principal Problem:   Alcohol withdrawal seizure with complication, with unspecified complication ([place]) Active Problems:   HTN (hypertension)   Alcohol abuse   Alcohol withdrawal ([place])   Total Time spent with patient: 30 minutes  Subjective:   Shawn Krueger is a 34 y.o. male patient.  Patient assessed by nurse practitioner.  Patient alert and oriented, answers appropriately.  Patient states "I was drinking about a fifth a day for about a year and I tried to quit and ended up having a seizure."  Patient states "I have anxiety and depression, I was treating myself with alcohol. I went to the doctor and got on Lexapro but it didn't help." Currently remains on lexapro x 7 months.  Patient denies suicidal ideations.  Patient denies history of suicide attempts, denies history of self-harm.  Patient denies homicidal ideations.  Patient denies auditory visual hallucinations.  Patient reports average appetite and "good" sleep. Lives with Mother and stepfather, denies weapons. Currently unemployed x 1 week, plans to get a job in Arch[employer] once discharged. Patient states "I do not want to drink anymore."  Patient reports plan to follow-up with outpatient psychiatry as well as alcohol use treatment. Patient gives verbal consent to call Shawn Krueger phone [phone] Spoke with patient's mother Shawn Krueger: Patient's mother denies concerns for patient safety.  Patient's mother reports patient has no access to weapons that she is aware of.  Patient's mother states  "I am only concerned about relapse, that is all."  HPI: Patient admitted with alcohol withdrawal and alcohol withdrawal seizure.  Past Psychiatric History: Sister-anxiety, depression  Risk to Self: Suicidal Ideation: No Suicidal Intent: No Is patient at risk for suicide?: Yes Suicidal Plan?: No What has been your use of drugs/alcohol within the last 12 months?: daily use until week ago How many times?: 0 Other Self Harm Risks: male, substance abuse, depression dx,  recent loss of job Intentional Self Injurious Behavior: None Risk to Others: Homicidal Ideation: No Thoughts of Harm to Others: No Current Homicidal Intent: No Current Homicidal Plan: No Access to Homicidal Means: No History of harm to others?: No Assessment of Violence: None Noted Does patient have access to weapons?: No Criminal Charges Pending?: No Does patient have a court date: No Prior Inpatient Therapy: Prior Inpatient Therapy: No Prior Outpatient Therapy: Prior Outpatient Therapy: No Does patient have an ACCT team?: No Does patient have Intensive In-House Services?  : No Does patient have [facility] services? : No Does patient have P4CC services?: No  Past Medical History:  Past Medical History:  Diagnosis Date  . ETOH abuse   . Hypertension   . Kidney stone     Past Surgical History:  Procedure Laterality Date  . TONSILLECTOMY     Family History: History reviewed. No pertinent family history. Family Psychiatric  History: Sister-anxiety, depression Social History:  Social History   Substance and Sexual Activity  Alcohol Use Yes   Comment: daily fifth     Social History   Substance and Sexual Activity  Drug Use Yes  . Types: Marijuana   Comment: xanax    Social History   Socioeconomic History  . Marital  status: Single    Spouse name: Not on file  . Number of children: Not on file  . Years of education: Not on file  . Highest education level: Not on file  Occupational History  . Not on  file  Tobacco Use  . Smoking status: Current Every Day Smoker    Packs/day: 1.00    Types: Cigarettes  Substance and Sexual Activity  . Alcohol use: Yes    Comment: daily fifth  . Drug use: Yes    Types: Marijuana    Comment: xanax  . Sexual activity: Not on file  Other Topics Concern  . Not on file  Social History Narrative  . Not on file   Social Determinants of Health   Financial Resource Strain:   . Difficulty of Paying Living Expenses: Not on file  Food Insecurity:   . Worried About Charity fundraiser in the Last Year: Not on file  . Ran Out of Food in the Last Year: Not on file  Transportation Needs:   . Lack of Transportation (Medical): Not on file  . Lack of Transportation (Non-Medical): Not on file  Physical Activity:   . Days of Exercise per Week: Not on file  . Minutes of Exercise per Session: Not on file  Stress:   . Feeling of Stress : Not on file  Social Connections:   . Frequency of Communication with Friends and Family: Not on file  . Frequency of Social Gatherings with Friends and Family: Not on file  . Attends Religious Services: Not on file  . Active Member of Clubs or Organizations: Not on file  . Attends Arch[employer] Meetings: Not on file  . Marital Status: Not on file   Additional Social History:    Allergies:  No Known Allergies  Labs:  Results for orders placed or performed during the hospital encounter of 11/22/19 (from the past 48 hour(s))  Comprehensive metabolic panel     Status: Abnormal   Collection Time: 11/25/19  5:23 AM  Result Value Ref Range   Sodium 132 (L) 135 - 145 mmol/L   Potassium 3.5 3.5 - 5.1 mmol/L   Chloride 101 98 - 111 mmol/L   CO2 23 22 - 32 mmol/L   Glucose, Bld 108 (H) 70 - 99 mg/dL    Comment: Glucose reference range applies only to samples taken after fasting for at least 8 hours.   BUN 6 6 - 20 mg/dL   Creatinine, Ser 0.65 0.61 - 1.24 mg/dL   Calcium 8.4 (L) 8.9 - 10.3 mg/dL   Total Protein 7.3  6.5 - 8.1 g/dL   Albumin 3.9 3.5 - 5.0 g/dL   AST 196 (H) 15 - 41 U/L   ALT 76 (H) 0 - 44 U/L   Alkaline Phosphatase 62 38 - 126 U/L   Total Bilirubin 1.3 (H) 0.3 - 1.2 mg/dL   GFR calc non Af Amer >60 >60 mL/min   GFR calc Af Amer >60 >60 mL/min   Anion gap 8 5 - 15    Comment: Performed at [facility], [address], [place], [place] [address]    Medications:  Current Facility-Administered Medications  Medication Dose Route Frequency Provider Last Rate Last Admin  . 0.9 %  sodium chloride infusion  250 mL Intravenous PRN Lynetta Mare, MD      . acetaminophen (TYLENOL) tablet 650 mg  650 mg Oral Q6H PRN Lynetta Mare, MD   650 mg at 11/22/19  2204   Or  . acetaminophen (TYLENOL) suppository 650 mg  650 mg Rectal Q6H PRN Lynetta Mare, MD      . alum & mag hydroxide-simeth (MAALOX/MYLANTA) 200-200-20 MG/5ML suspension 30 mL  30 mL Oral Q6H PRN Lynetta Mare, MD      . amLODipine (NORVASC) tablet 10 mg  10 mg Oral Daily Denton Brick, Courage, MD   10 mg at 11/25/19 0946  . bisacodyl (DULCOLAX) suppository 10 mg  10 mg Rectal Daily PRN Lynetta Mare, MD      . busPIRone (BUSPAR) tablet 10 mg  10 mg Oral TID Roxan Hockey, MD   10 mg at 11/25/19 0945  . dextrose 5 %-0.45 % sodium chloride infusion   Intravenous Continuous Emokpae, Courage, MD 100 mL/hr at 11/25/19 1016 New Bag at 11/25/19 1016  . diazepam (VALIUM) tablet 5 mg  5 mg Oral TID Roxan Hockey, MD   5 mg at 11/25/19 0945  . enoxaparin (LOVENOX) injection 40 mg  40 mg Subcutaneous Q24H Lynetta Mare, MD   40 mg at 11/24/19 2159  . escitalopram (LEXAPRO) tablet 20 mg  20 mg Oral Daily Emokpae, Courage, MD   20 mg at 11/25/19 0946  . folic acid (FOLVITE) tablet 1 mg  1 mg Oral Daily Emokpae, Courage, MD   1 mg at 11/25/19 0944  . ketorolac (TORADOL) 15 MG/ML injection 15 mg  15 mg Intravenous Q8H PRN Lynetta Mare, MD   15 mg at 11/24/19 1245  . lidocaine (XYLOCAINE) 2 % viscous mouth solution 15 mL  15  mL Mouth/Throat Q3H PRN Lynetta Mare, MD   15 mL at 11/24/19 1626  . LORazepam (ATIVAN) injection 0-4 mg  0-4 mg Intravenous Q12H Lynetta Mare, MD       Or  . LORazepam (ATIVAN) tablet 0-4 mg  0-4 mg Oral Q12H Lynetta Mare, MD      . nicotine (NICODERM CQ - dosed in mg/24 hours) patch 21 mg  21 mg Transdermal Daily Lynetta Mare, MD   21 mg at 11/25/19 0946  . ondansetron (ZOFRAN) tablet 4 mg  4 mg Oral Q8H PRN Lynetta Mare, MD       Or  . ondansetron [facility]) injection 4 mg  4 mg Intravenous Q6H PRN Lynetta Mare, MD      . phenol (CHLORASEPTIC) mouth spray 1 spray  1 spray Mouth/Throat PRN Lynetta Mare, MD   1 spray at 11/23/19 0416  . polyethylene glycol (MIRALAX / GLYCOLAX) packet 17 g  17 g Oral Daily PRN Lynetta Mare, MD      . sodium chloride flush (NS) 0.9 % injection 3 mL  3 mL Intravenous Q12H Lynetta Mare, MD   3 mL at 11/24/19 2200  . sodium chloride flush (NS) 0.9 % injection 3 mL  3 mL Intravenous PRN Lynetta Mare, MD      . thiamine tablet 100 mg  100 mg Oral Daily Vivia Ewing M, MD   100 mg at 11/25/19 0945   Or  . thiamine (B-1) injection 100 mg  100 mg Intravenous Daily Lynetta Mare, MD   100 mg at 11/22/19 1758  . zolpidem (AMBIEN) tablet 5 mg  5 mg Oral QHS PRN Lynetta Mare, MD        Musculoskeletal: Strength & Muscle Tone: within normal limits Gait & Station: normal Patient leans: N/A  Psychiatric Specialty Exam: Physical Exam  Nursing note and  vitals reviewed. Constitutional: He is oriented to person, place, and time. He appears well-developed.  HENT:  Head: Normocephalic.  Cardiovascular: Normal rate.  Respiratory: Effort normal.  Musculoskeletal:        General: Normal range of motion.     Cervical back: Normal range of motion.  Neurological: He is alert and oriented to person, place, and time.  Psychiatric: He has a normal mood and affect. His behavior is normal. Judgment and thought content  normal.    Review of Systems  Constitutional: Negative.   HENT: Negative.   Eyes: Negative.   Respiratory: Negative.   Cardiovascular: Negative.   Gastrointestinal: Negative.   Genitourinary: Negative.   Musculoskeletal: Negative.   Skin: Negative.   Neurological: Negative.     Blood pressure (!) 149/86, pulse 73, temperature 98.7 F (37.1 C), temperature source Oral, resp. rate 17, height 5\' 9"  (1.753 m), weight 98.1 kg, SpO2 99 %.Body mass index is 31.94 kg/m.  General Appearance: Casual and Fairly Groomed  Eye Contact:  Good  Speech:  Clear and Coherent and Normal Rate  Volume:  Normal  Mood:  Euthymic  Affect:  Appropriate and Congruent  Thought Process:  Coherent, Goal Directed and Descriptions of Associations: Intact  Orientation:  Full (Time, Place, and Person)  Thought Content:  WDL and Logical  Suicidal Thoughts:  No  Homicidal Thoughts:  No  Memory:  Immediate;   Good Recent;   Good Remote;   Good  Judgement:  Good  Insight:  Good  Psychomotor Activity:  Normal  Concentration:  Concentration: Good and Attention Span: Good  Recall:  Good  Fund of Knowledge:  Good  Language:  Good  Akathisia:  No  Handed:  Right  AIMS (if indicated):     Assets:  Communication Skills Desire for Improvement Financial Resources/Insurance Housing Intimacy Leisure Time [place] Talents/Skills Transportation Vocational/Educational  ADL's:  Intact  Cognition:  WNL  Sleep:        Treatment Plan Summary: Plan Follow-up with outpatient psychiatry and outpatient substance use treatment resources.  Case discussed with Dr Dwyane Dee  Disposition: No evidence of imminent risk to self or others at present.   Patient does not meet criteria for psychiatric inpatient admission. Discussed crisis plan, support from social network, calling 911, coming to the Emergency Department, and calling Suicide Hotline.  This service was provided via telemedicine  using a 2-way, interactive audio and video technology.  Names of all persons participating in this telemedicine service and their role in this encounter. Name: Shawn Krueger Role: Patient  Name: Melton Krebs telephone Role: Patient's mother  Name: Letitia Libra Role: [place], [place] 11/25/2019 10:32 AM

## 2019-11-27 ENCOUNTER — Ambulatory Visit
Admission: EM | Admit: 2019-11-27 | Discharge: 2019-11-27 | Disposition: A | Discharge status: Home / Self Care | Payer: Self-pay | Attending: Emergency Medicine | Admitting: Emergency Medicine

## 2019-11-27 ENCOUNTER — Other Ambulatory Visit: Payer: Self-pay

## 2019-11-27 DIAGNOSIS — S01512A Laceration without foreign body of oral cavity, initial encounter: Secondary | ICD-10-CM

## 2019-11-27 MED ORDER — AMOXICILLIN-POT CLAVULANATE 875-125 MG PO TABS: 1.0000 | ORAL_TABLET | Freq: Two times a day (BID) | ORAL | 0 refills | Status: AC

## 2019-11-27 MED ORDER — CEFTRIAXONE SODIUM 1 G IJ SOLR
1.0000 g | Freq: Once | INTRAMUSCULAR | Status: AC
  Administered 2019-11-27: 1 g via INTRAMUSCULAR

## 2019-11-27 MED ORDER — MELOXICAM 7.5 MG PO TABS: 7.5000 mg | ORAL_TABLET | Freq: Every day | ORAL | 0 refills | Status: DC

## 2019-11-27 NOTE — ED Triage Notes (Signed)
Pt presents to UC w/ c/o tongue lac which occurred after a seizure 6 days ago. Pt's tongue is bruised, swollen, and painful. Pt thinks it may be infected.

## 2019-11-27 NOTE — ED Provider Notes (Signed)
[facility]   [id number] 11/27/19 Arrival Time: 62  CC: Tongue lac infection  SUBJECTIVE:  Shawn Krueger is a 34 y.o. male who presents with tongue laceration that occurred 6 days ago.  Had a seizure following alcohol withdrawal and bit tongue.  Was seen at [facility] ED.  Localizes lac to RT side of tongue.  Has tried OTC analgesics without relief.  Worse with swallowing, but tolerating liquids and own secretions without diffiuclty.  Denies similar symptoms in the past.  Denies fever, chills, dysphagia, odynophagia, oral or neck swelling, nausea, vomiting, chest pain, SOB.    ROS: As per HPI.  All other pertinent ROS negative.     Past Medical History:  Diagnosis Date  . ETOH abuse   . Hypertension   . Kidney stone   . Seizure ([place]) 11/22/2019   Past Surgical History:  Procedure Laterality Date  . TONSILLECTOMY     No Known Allergies No current facility-administered medications on file prior to encounter.   Current Outpatient Medications on File Prior to Encounter  Medication Sig Dispense Refill  . amLODipine (NORVASC) 10 MG tablet Take 1 tablet (10 mg total) by mouth daily. 30 tablet 0  . busPIRone (BUSPAR) 7.5 MG tablet Take 1 tablet (7.5 mg total) by mouth 3 (three) times daily. 90 tablet 0  . escitalopram (LEXAPRO) 20 MG tablet Take 1 tablet (20 mg total) by mouth daily. 30 tablet 1   Social History   Socioeconomic History  . Marital status: Single    Spouse name: Not on file  . Number of children: Not on file  . Years of education: Not on file  . Highest education level: Not on file  Occupational History  . Not on file  Tobacco Use  . Smoking status: Current Every Day Smoker    Packs/day: 1.00    Types: Cigarettes  . Smokeless tobacco: Never Used  Substance and Sexual Activity  . Alcohol use: Not Currently    Comment: quit 5 days ago  . Drug use: Not Currently    Types: Marijuana  . Sexual activity: Not on file  Other Topics Concern  . Not on  file  Social History Narrative  . Not on file   Social Determinants of Health   Financial Resource Strain:   . Difficulty of Paying Living Expenses: Not on file  Food Insecurity:   . Worried About Charity fundraiser in the Last Year: Not on file  . Ran Out of Food in the Last Year: Not on file  Transportation Needs:   . Lack of Transportation (Medical): Not on file  . Lack of Transportation (Non-Medical): Not on file  Physical Activity:   . Days of Exercise per Week: Not on file  . Minutes of Exercise per Session: Not on file  Stress:   . Feeling of Stress : Not on file  Social Connections:   . Frequency of Communication with Friends and Family: Not on file  . Frequency of Social Gatherings with Friends and Family: Not on file  . Attends Religious Services: Not on file  . Active Member of Clubs or Organizations: Not on file  . Attends Archivist Meetings: Not on file  . Marital Status: Not on file  Intimate Partner Violence:   . Fear of Current or Ex-Partner: Not on file  . Emotionally Abused: Not on file  . Physically Abused: Not on file  . Sexually Abused: Not on file   Family History  Problem Relation Age of Onset  . Healthy Mother   . Healthy Father     OBJECTIVE:  Vitals:   11/27/19 1039  BP: (!) 151/89  Pulse: (!) 106  Resp: 18  Temp: 98.4 F (36.9 C)  TempSrc: Tympanic  SpO2: 96%    Recheck: 96 bpm  General appearance: alert; no distress HENT: normocephalic; atraumatic; EACs clear, TMs pearly gray; nares patent; oropharynx clear; dental poor, multiple caries and tartar build--up; superficial laceration present to RT side of tongue without obvious bleeding or discharge; foul odor present Neck: supple without LAD CV: RRR Lungs: normal respirations; CTAB Skin: warm and dry Psychological: alert and cooperative; normal mood and affect  ASSESSMENT & PLAN:  1. Laceration of tongue with complication, initial encounter     Meds ordered this  encounter  Medications  . amoxicillin-clavulanate (AUGMENTIN) 875-125 MG tablet    Sig: Take 1 tablet by mouth every 12 (twelve) hours for 10 days.    Dispense:  20 tablet    Refill:  0    Order Specific Question:   Supervising Provider    Answer:   Raylene Everts [id number]  . cefTRIAXone (ROCEPHIN) injection 1 g  . meloxicam (MOBIC) 7.5 MG tablet    Sig: Take 1 tablet (7.5 mg total) by mouth daily.    Dispense:  30 tablet    Refill:  0    Order Specific Question:   Supervising Provider    Answer:   Raylene Everts [id number]    Practice good dental hygiene including brushing twice daily and performing daily mouth wash rinses Rinse mouth with water after eating Rocephin given in office.   Augmentin prescribed.  Take as directed and to completion Mobic for pain.  DO NOT TAKE with lexapro Call 911 or go to the ED if you have any new or worsening symptoms such as fever, chills, difficulty swallowing, painful swallowing, oral or neck swelling, trouble breathing, drooling, nausea, vomiting, chest pain, SOB, worsening symptoms despite medication, etc...  Reviewed expectations re: course of current medical issues. Questions answered. Outlined signs and symptoms indicating need for more acute intervention. Patient verbalized understanding. After Visit Summary given.   Lestine Box, PA-C 11/27/19 1115

## 2019-11-27 NOTE — Discharge Instructions (Addendum)
Practice good dental hygiene including brushing twice daily and performing daily mouth wash rinses Rinse mouth with water after eating Rocephin given in office.   Augmentin prescribed.  Take as directed and to completion Mobic for pain.  DO NOT TAKE with lexapro Call 911 or go to the ED if you have any new or worsening symptoms such as fever, chills, difficulty swallowing, painful swallowing, oral or neck swelling, trouble breathing, drooling, nausea, vomiting, chest pain, SOB, worsening symptoms despite medication, etc..Marland Kitchen

## 2020-04-25 ENCOUNTER — Encounter (HOSPITAL_COMMUNITY): Payer: Self-pay | Admitting: Emergency Medicine

## 2020-04-25 ENCOUNTER — Other Ambulatory Visit: Payer: Self-pay

## 2020-04-25 ENCOUNTER — Emergency Department (HOSPITAL_COMMUNITY)
Admission: EM | Admit: 2020-04-25 | Discharge: 2020-04-25 | Disposition: A | Discharge status: Home / Self Care | Payer: Self-pay | Attending: Emergency Medicine | Admitting: Emergency Medicine

## 2020-04-25 DIAGNOSIS — F1020 Alcohol dependence, uncomplicated: Secondary | ICD-10-CM

## 2020-04-25 DIAGNOSIS — Y908 Blood alcohol level of 240 mg/100 ml or more: Secondary | ICD-10-CM | POA: Insufficient documentation

## 2020-04-25 DIAGNOSIS — F17210 Nicotine dependence, cigarettes, uncomplicated: Secondary | ICD-10-CM | POA: Insufficient documentation

## 2020-04-25 DIAGNOSIS — D751 Secondary polycythemia: Secondary | ICD-10-CM

## 2020-04-25 DIAGNOSIS — Z79899 Other long term (current) drug therapy: Secondary | ICD-10-CM | POA: Insufficient documentation

## 2020-04-25 DIAGNOSIS — D45 Polycythemia vera: Secondary | ICD-10-CM | POA: Insufficient documentation

## 2020-04-25 DIAGNOSIS — F10220 Alcohol dependence with intoxication, uncomplicated: Secondary | ICD-10-CM | POA: Insufficient documentation

## 2020-04-25 DIAGNOSIS — F10920 Alcohol use, unspecified with intoxication, uncomplicated: Secondary | ICD-10-CM

## 2020-04-25 DIAGNOSIS — I10 Essential (primary) hypertension: Secondary | ICD-10-CM | POA: Insufficient documentation

## 2020-04-25 DIAGNOSIS — Z20822 Contact with and (suspected) exposure to covid-19: Secondary | ICD-10-CM | POA: Insufficient documentation

## 2020-04-25 LAB — CBC WITH DIFFERENTIAL/PLATELET
Abs Immature Granulocytes: 0.04 10*3/uL (ref 0.00–0.07)
Basophils Absolute: 0.1 10*3/uL (ref 0.0–0.1)
Basophils Relative: 1 %
Eosinophils Absolute: 0.2 10*3/uL (ref 0.0–0.5)
Eosinophils Relative: 3 %
HCT: 55.4 % — ABNORMAL HIGH (ref 39.0–52.0)
Hemoglobin: 18.7 g/dL — ABNORMAL HIGH (ref 13.0–17.0)
Immature Granulocytes: 1 %
Lymphocytes Relative: 42 %
Lymphs Abs: 3.6 10*3/uL (ref 0.7–4.0)
MCH: 32.7 pg (ref 26.0–34.0)
MCHC: 33.8 g/dL (ref 30.0–36.0)
MCV: 96.9 fL (ref 80.0–100.0)
Monocytes Absolute: 0.8 10*3/uL (ref 0.1–1.0)
Monocytes Relative: 10 %
Neutro Abs: 3.5 10*3/uL (ref 1.7–7.7)
Neutrophils Relative %: 43 %
Platelets: 234 10*3/uL (ref 150–400)
RBC: 5.72 MIL/uL (ref 4.22–5.81)
RDW: 15.8 % — ABNORMAL HIGH (ref 11.5–15.5)
WBC: 8.2 10*3/uL (ref 4.0–10.5)
nRBC: 0 % (ref 0.0–0.2)

## 2020-04-25 LAB — COMPREHENSIVE METABOLIC PANEL
ALT: 46 U/L — ABNORMAL HIGH (ref 0–44)
AST: 31 U/L (ref 15–41)
Albumin: 4.4 g/dL (ref 3.5–5.0)
Alkaline Phosphatase: 56 U/L (ref 38–126)
Anion gap: 13 (ref 5–15)
BUN: 10 mg/dL (ref 6–20)
CO2: 23 mmol/L (ref 22–32)
Calcium: 8.3 mg/dL — ABNORMAL LOW (ref 8.9–10.3)
Chloride: 104 mmol/L (ref 98–111)
Creatinine, Ser: 0.78 mg/dL (ref 0.61–1.24)
GFR calc Af Amer: >60 mL/min (ref >60)
GFR calc non Af Amer: >60 mL/min (ref >60)
Glucose, Bld: 136 mg/dL — ABNORMAL HIGH (ref 70–99)
Potassium: 3.5 mmol/L (ref 3.5–5.1)
Sodium: 140 mmol/L (ref 135–145)
Total Bilirubin: 0.2 mg/dL — ABNORMAL LOW (ref 0.3–1.2)
Total Protein: 7.4 g/dL (ref 6.5–8.1)

## 2020-04-25 LAB — RAPID URINE DRUG SCREEN, HOSP PERFORMED
Amphetamines: NOT DETECTED
Barbiturates: NOT DETECTED
Benzodiazepines: NOT DETECTED
Cocaine: NOT DETECTED
Opiates: NOT DETECTED
Tetrahydrocannabinol: NOT DETECTED

## 2020-04-25 LAB — ETHANOL: Alcohol, Ethyl (B): 247 mg/dL — ABNORMAL HIGH (ref <10)

## 2020-04-25 LAB — SARS CORONAVIRUS 2 BY RT PCR (HOSPITAL ORDER, PERFORMED IN ~~LOC~~ HOSPITAL LAB): SARS Coronavirus 2: NEGATIVE

## 2020-04-25 MED ORDER — LORAZEPAM 2 MG/ML IJ SOLN: 0.0000 mg | Freq: Two times a day (BID) | INTRAMUSCULAR | Status: DC

## 2020-04-25 MED ORDER — LORAZEPAM 1 MG PO TABS: 0.0000 mg | ORAL_TABLET | Freq: Four times a day (QID) | ORAL | Status: DC

## 2020-04-25 MED ORDER — THIAMINE HCL 100 MG/ML IJ SOLN: 100.0000 mg | Freq: Every day | INTRAMUSCULAR | Status: DC

## 2020-04-25 MED ORDER — LORAZEPAM 1 MG PO TABS: 0.0000 mg | ORAL_TABLET | Freq: Two times a day (BID) | ORAL | Status: DC

## 2020-04-25 MED ORDER — CHLORDIAZEPOXIDE HCL 25 MG PO CAPS
ORAL_CAPSULE | ORAL | 0 refills | Status: DC
Start: 2020-04-25 — End: 2022-11-30

## 2020-04-25 MED ORDER — THIAMINE HCL 100 MG PO TABS: 100.0000 mg | ORAL_TABLET | Freq: Every day | ORAL | Status: DC

## 2020-04-25 MED ORDER — LORAZEPAM 2 MG/ML IJ SOLN: 0.0000 mg | Freq: Four times a day (QID) | INTRAMUSCULAR | Status: DC

## 2020-04-25 NOTE — ED Triage Notes (Signed)
Pt states he is here for detox from alcohol. States he last drank tonight.

## 2020-04-25 NOTE — ED Provider Notes (Signed)
[facility] EMERGENCY DEPARTMENT Provider Note   CSN: [id number] Arrival date & time: 04/25/20  0022   History Chief Complaint  Patient presents with  . Detox    EUSTACIO Krueger is a 34 y.o. male.  The history is provided by the patient.  He has a history of hypertension, alcohol abuse with alcohol withdrawal seizure and comes in requesting inpatient detox.  He had gone through detox about 6 months ago and was clean for a while but started drinking again.  He is drinking about seven 25 ounce bottles of beer a day.  He denies drug use.  He denies depression, homicidal ideation, suicidal ideation.  Last drink was earlier this evening.  Past Medical History:  Diagnosis Date  . ETOH abuse   . Hypertension   . Kidney stone   . Seizure ([place]) 11/22/2019    Patient Active Problem List   Diagnosis Date Noted  . HTN (hypertension) 11/22/2019  . Alcohol abuse 11/22/2019  . Alcohol withdrawal seizure with complication, with unspecified complication ([place]) 82/95/6213  . Alcohol withdrawal ([place]) 11/22/2019    Past Surgical History:  Procedure Laterality Date  . TONSILLECTOMY         Family History  Problem Relation Age of Onset  . Healthy Mother   . Healthy Father     Social History   Tobacco Use  . Smoking status: Current Every Day Smoker    Packs/day: 1.00    Types: Cigarettes  . Smokeless tobacco: Never Used  Substance Use Topics  . Alcohol use: Yes    Comment: quit 5 days ago  . Drug use: Not Currently    Types: Marijuana    Home Medications Prior to Admission medications   Medication Sig Start Date End Date Taking? Authorizing Provider  amLODipine (NORVASC) 10 MG tablet Take 1 tablet (10 mg total) by mouth daily. 11/26/19   Barton Dubois, MD  busPIRone (BUSPAR) 7.5 MG tablet Take 1 tablet (7.5 mg total) by mouth 3 (three) times daily. 11/25/19   Barton Dubois, MD  escitalopram (LEXAPRO) 20 MG tablet Take 1 tablet (20 mg total) by mouth daily. 11/26/19   Barton Dubois, MD  meloxicam (MOBIC) 7.5 MG tablet Take 1 tablet (7.5 mg total) by mouth daily. 11/27/19   [place], [place], PA-C    Allergies    Patient has no known allergies.  Review of Systems   Review of Systems  All other systems reviewed and are negative.   Physical Exam Updated Vital Signs BP (!) 149/87 (BP Location: Right Arm)   Pulse 94   Temp 98.3 F (36.8 C) (Oral)   Resp 17   SpO2 94%   Physical Exam Vitals and nursing note reviewed.   34 year old male, resting comfortably and in no acute distress. Vital signs are significant for elevated blood pressure. Oxygen saturation is 94%, which is normal. Head is normocephalic and atraumatic. PERRLA, EOMI. Oropharynx is clear. Neck is nontender and supple without adenopathy or JVD. Back is nontender and there is no CVA tenderness. Lungs are clear without rales, wheezes, or rhonchi. Chest is nontender. Heart has regular rate and rhythm without murmur. Abdomen is soft, flat, nontender without masses or hepatosplenomegaly and peristalsis is normoactive. Extremities have no cyanosis or edema, full range of motion is present. Skin is warm and dry without rash. Neurologic: Mental status is normal, cranial nerves are intact, there are no motor or sensory deficits.  ED Results / Procedures / Treatments   Labs (all  labs ordered are listed, but only abnormal results are displayed) Labs Reviewed  COMPREHENSIVE METABOLIC PANEL - Abnormal; Notable for the following components:      Result Value   Glucose, Bld 136 (*)    Calcium 8.3 (*)    ALT 46 (*)    Total Bilirubin 0.2 (*)    All other components within normal limits  ETHANOL - Abnormal; Notable for the following components:   Alcohol, Ethyl (B) 247 (*)    All other components within normal limits  CBC WITH DIFFERENTIAL/PLATELET - Abnormal; Notable for the following components:   Hemoglobin 18.7 (*)    HCT 55.4 (*)    RDW 15.8 (*)    All other components within normal limits    SARS CORONAVIRUS 2 BY RT PCR (HOSPITAL ORDER, [place] [facility])  RAPID URINE DRUG SCREEN, HOSP PERFORMED    Procedures Procedures   Medications Ordered in ED Medications  LORazepam (ATIVAN) injection 0-4 mg (0 mg Intravenous Not Given 04/25/20 0418)    Or  LORazepam (ATIVAN) tablet 0-4 mg ( Oral See Alternative 04/25/20 0418)  LORazepam (ATIVAN) injection 0-4 mg (has no administration in time range)    Or  LORazepam (ATIVAN) tablet 0-4 mg (has no administration in time range)  thiamine tablet 100 mg (has no administration in time range)    Or  thiamine (B-1) injection 100 mg (has no administration in time range)    ED Course  I have reviewed the triage vital signs and the nursing notes.  Pertinent labs & imaging results that were available during my care of the patient were reviewed by me and considered in my medical decision making (see chart for details).  MDM Rules/Calculators/A&P Alcohol abuse with history of alcohol withdrawal seizure.  He does not show any signs of alcohol withdrawal at this time.  Will check labs including ethanol level.  Old records reviewed confirming hospital admission for alcohol withdrawal seizure in February of this year.  Ethanol level is 247.  Other significant labs include elevated hemoglobin and hematocrit.  He has been observed in the ED and has not shown any signs of tremulousness.  He expressed desire to be discharged.  Since he was not showing signs of alcohol withdrawal, it was thought he was safe for discharge and is given appropriate outpatient resources as well as residential resources.  He is given a prescription for chlordiazepoxide to use over the next 3 days if he develops symptoms of alcohol withdrawal.  Return precautions discussed.  Final Clinical Impression(s) / ED Diagnoses Final diagnoses:  Alcohol intoxication, uncomplicated ([place])  Alcohol use disorder, severe, dependence ([place])  Polycythemia    Rx / DC  Orders ED Discharge Orders         Ordered    chlordiazePOXIDE (LIBRIUM) 25 MG capsule     Discontinue  Reprint     04/25/20 4097           Delora Fuel, MD 35/32/99 [phone]

## 2020-04-25 NOTE — Discharge Instructions (Signed)
Return if you are having any problems. 

## 2021-08-29 IMAGING — CT CT CERVICAL SPINE W/O CM
3 of 4 series · 13 of 33 positions shown, 16 images · non-contrast
Comparison: None.

CLINICAL DATA: Possible seizure. Woke up on bathroom floor without
a recollection of preceding events.

EXAM:
CT HEAD WITHOUT CONTRAST
CT CERVICAL SPINE WITHOUT CONTRAST
TECHNIQUE: Multidetector CT imaging of the head and cervical spine was
performed following the standard protocol without intravenous
contrast. Multiplanar CT image reconstructions of the cervical spine
were also generated.

[Series 4: sagittal bone · sagittal · 0.39mm/px · 5 of 61 slices shown, 6 images]
[im 21/61  bone]
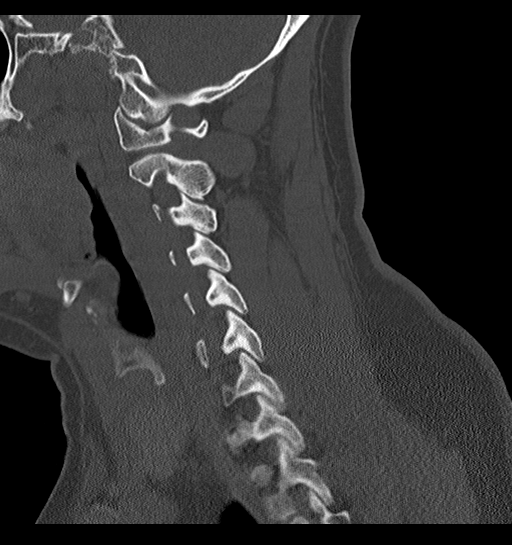
[im 26/61  bone]
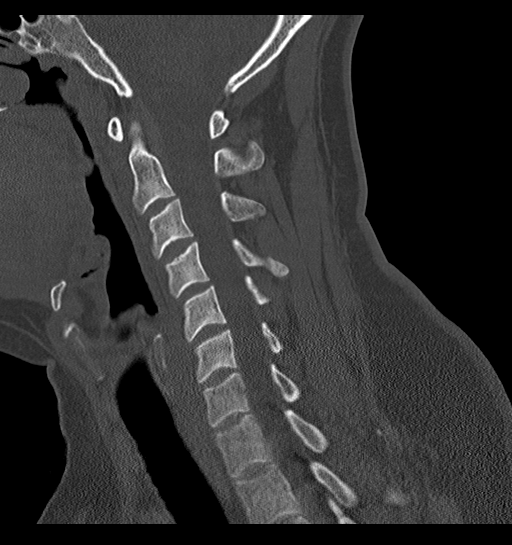
[im 31/61  soft-tissue]
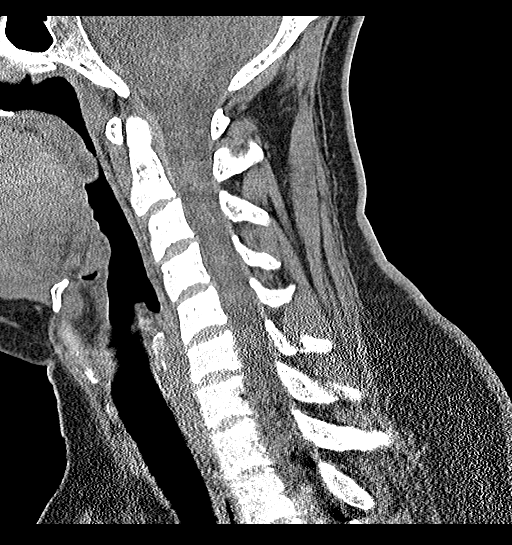
[im 31/61  bone]
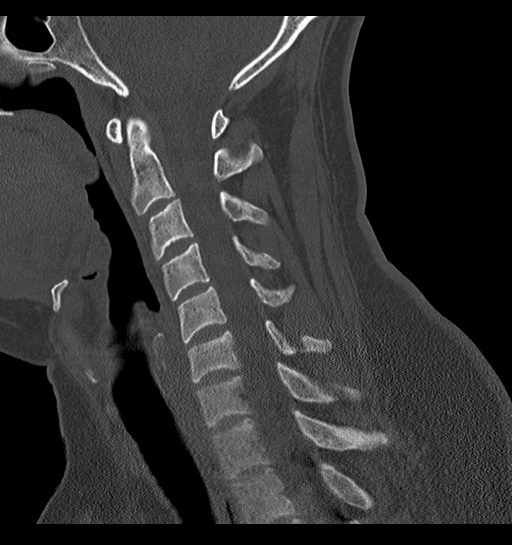
[im 36/61  bone]
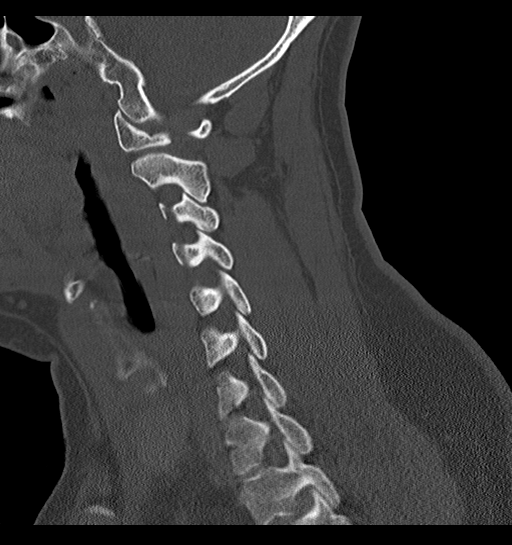
[im 41/61  bone]
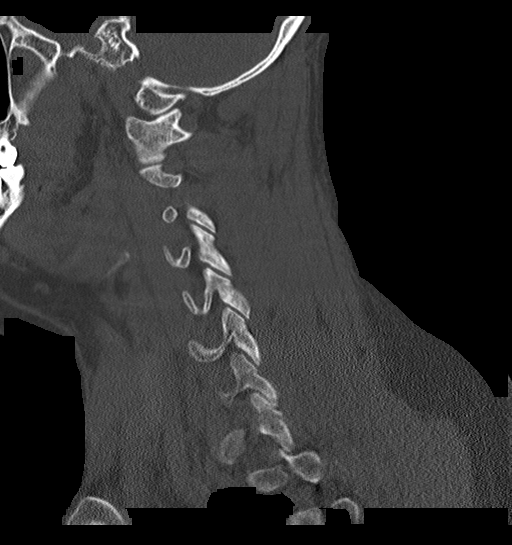

[Series 5: coronal bone · coronal · 0.29mm/px · 3 of 61 slices shown]
[im 13/61  bone]
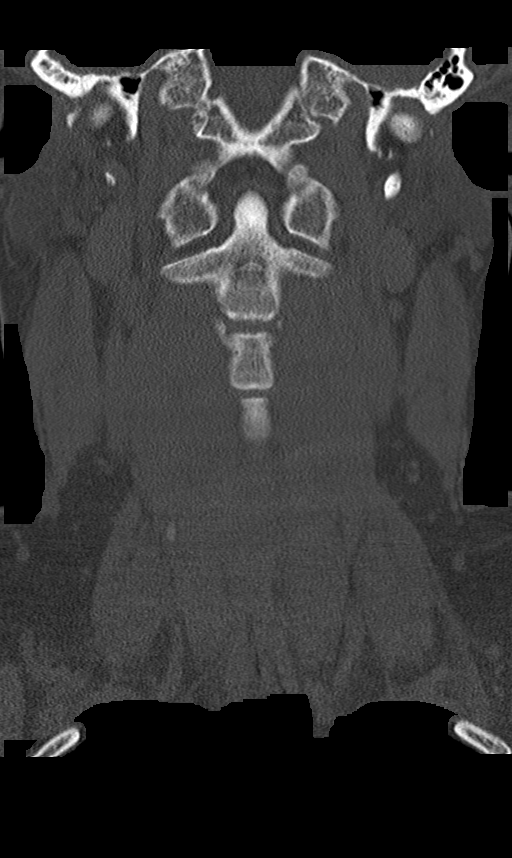
[im 25/61  bone]
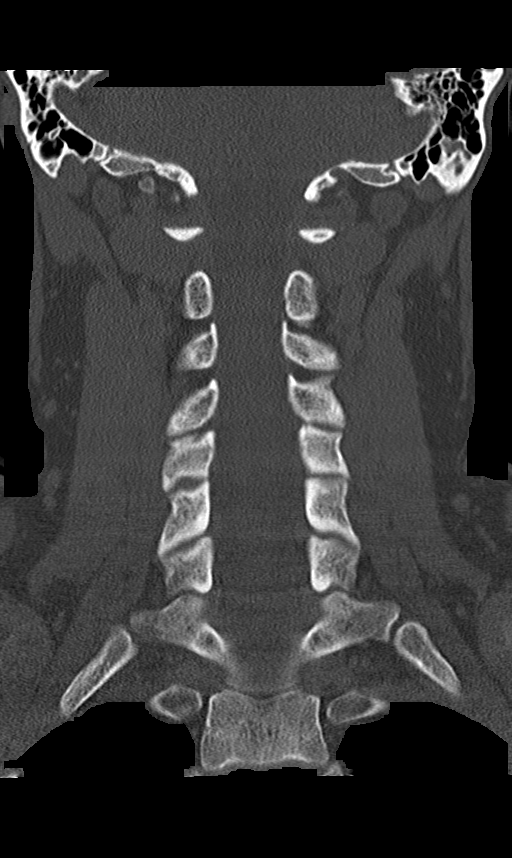
[im 37/61  bone]
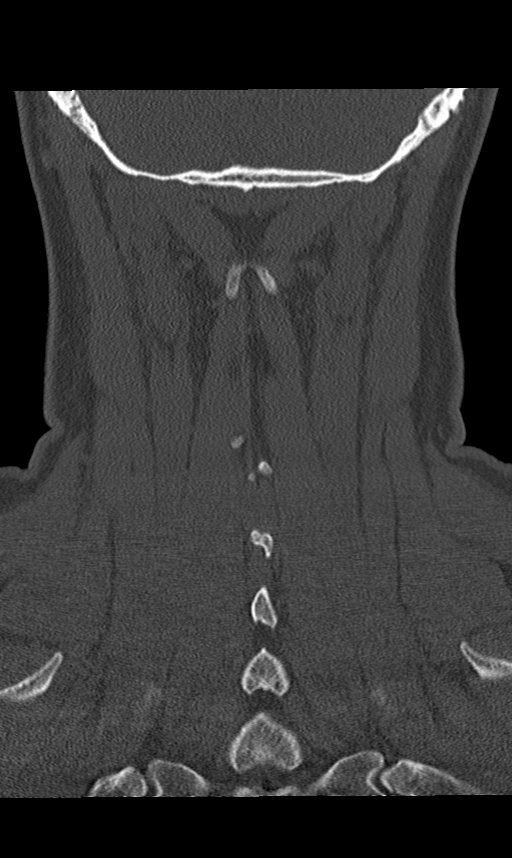

[Series 7: orthogonal axials · axial · 0.21mm/px · z∈[+1097,+1213]mm · 5 of 96 slices shown, 7 images]
[im 16/96  soft-tissue]
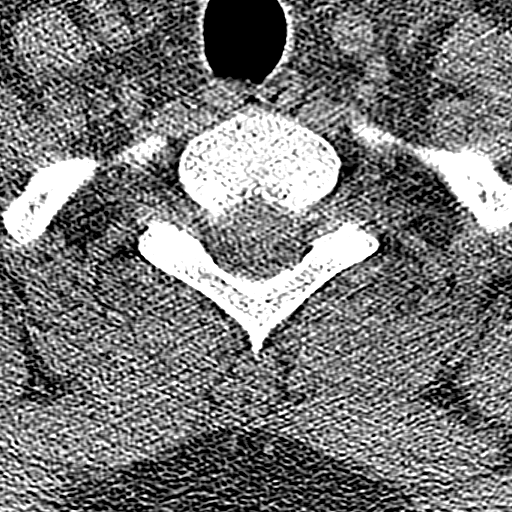
[im 16/96  bone]
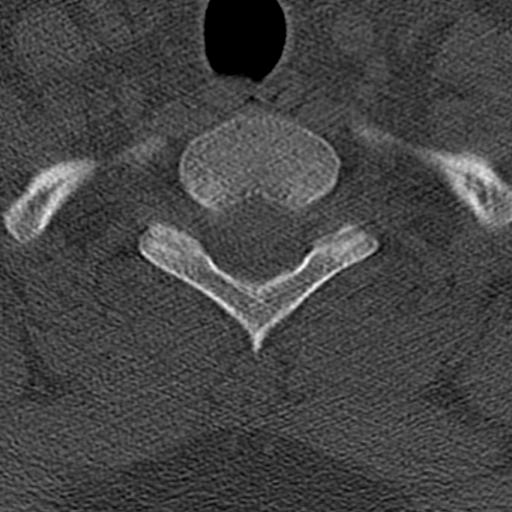
[im 32/96  bone]
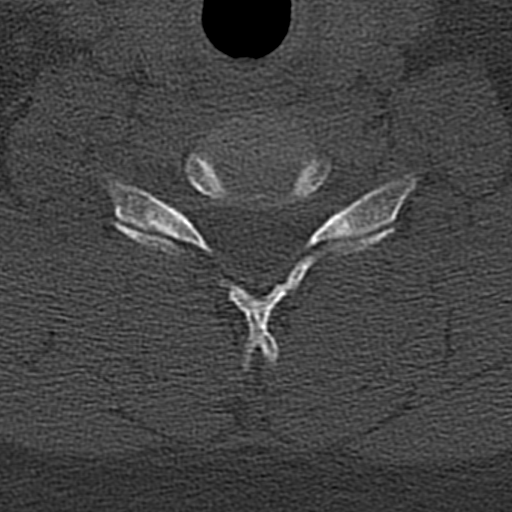
[im 48/96  bone]
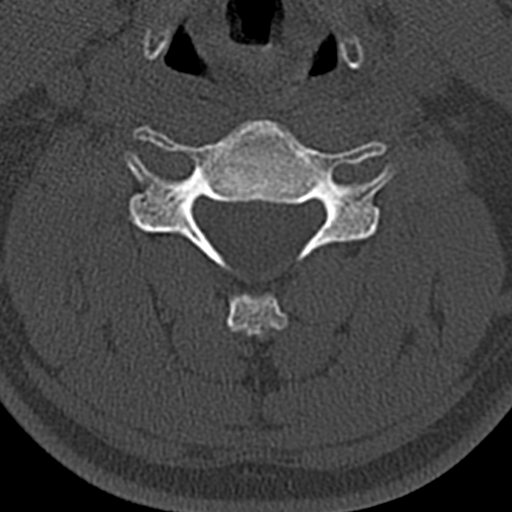
[im 64/96  bone]
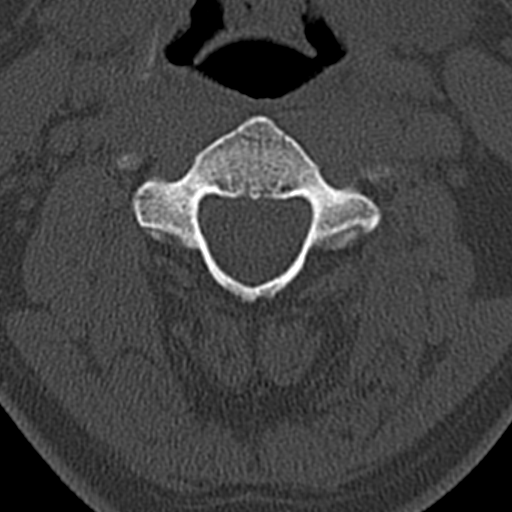
[im 80/96  soft-tissue]
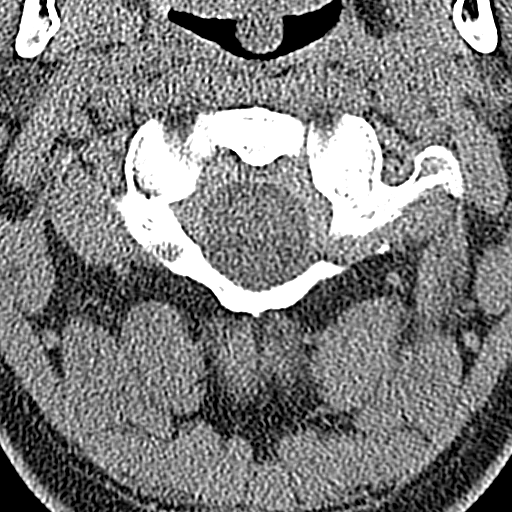
[im 80/96  bone]
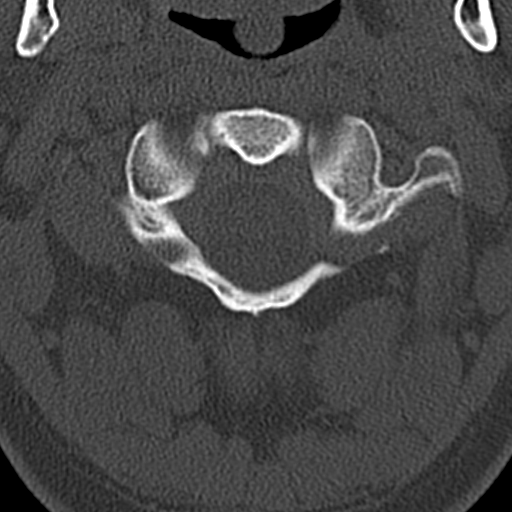

[13 of 33 positions shown; findings below may reference images not displayed]

FINDINGS: CT HEAD FINDINGS

Brain: There is no evidence of acute infarct, intracranial
hemorrhage, mass, midline shift, or extra-axial fluid collection.
The ventricles and sulci are normal.

Vascular: No hyperdense vessel.

Skull: No fracture or suspicious osseous lesion.

Sinuses/Orbits: Visualized paranasal sinuses are clear. Small left
mastoid effusion. Unremarkable orbits.

Other: None.

CT CERVICAL SPINE FINDINGS

Alignment: Slight reversal of the normal cervical lordosis. No
listhesis.

Skull base and vertebrae: No acute fracture or suspicious osseous
lesion.

Soft tissues and spinal canal: No prevertebral fluid or swelling. No
visible canal hematoma.

Disc levels:  Unremarkable.

Upper chest: Clear lung apices.

Other: None.
IMPRESSION: No evidence of acute intracranial or cervical spine injury.

## 2021-08-29 IMAGING — CT CT HEAD W/O CM
3 of 4 series · 15 of 47 positions shown, 18 images · non-contrast
Comparison: None.

CLINICAL DATA: Possible seizure. Woke up on bathroom floor without
a recollection of preceding events.

EXAM:
CT HEAD WITHOUT CONTRAST
CT CERVICAL SPINE WITHOUT CONTRAST
TECHNIQUE: Multidetector CT imaging of the head and cervical spine was
performed following the standard protocol without intravenous
contrast. Multiplanar CT image reconstructions of the cervical spine
were also generated.

[Series 2: head w o · axial · 0.41mm/px · z∈[+1276,+1411]mm · 9 of 33 slices shown, 12 images]
[im 3/33  brain]
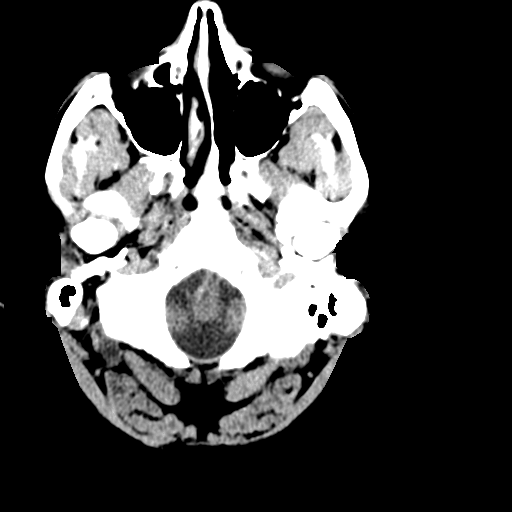
[im 3/33  bone]
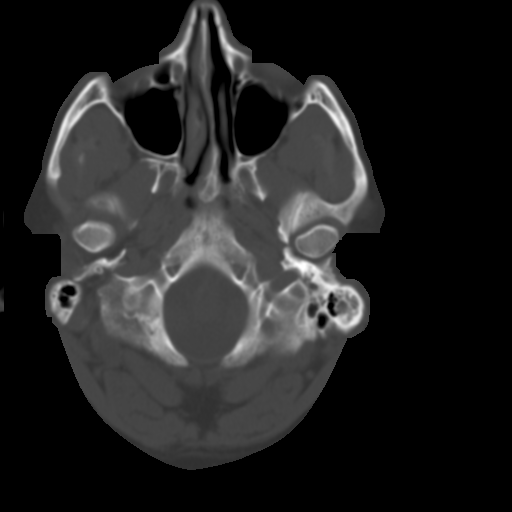
[im 7/33  brain]
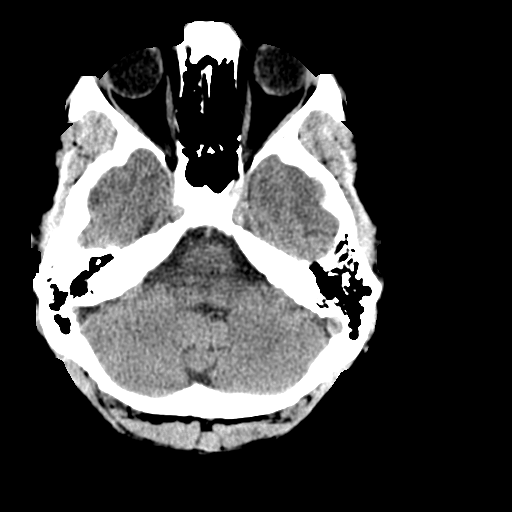
[im 10/33  brain]
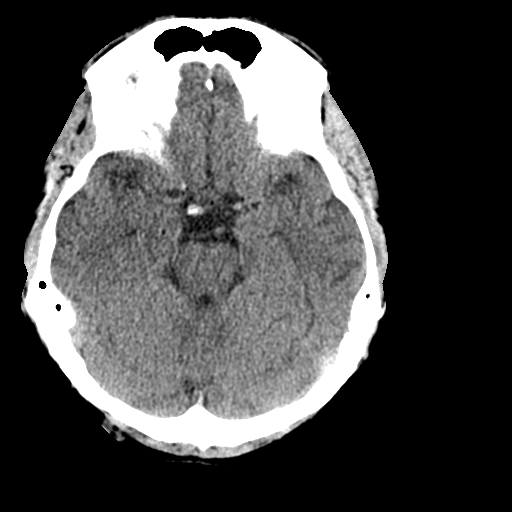
[im 14/33  brain]
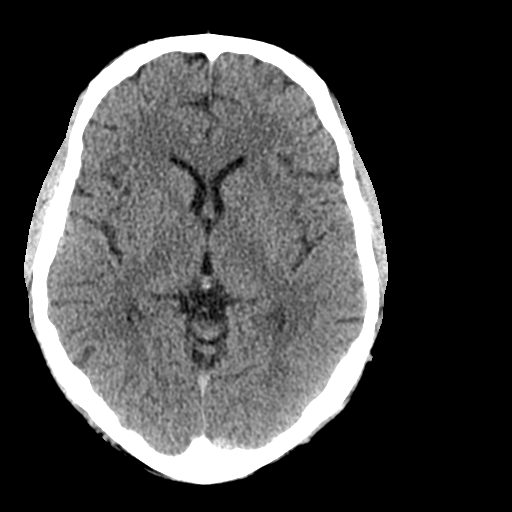
[im 17/33  brain]
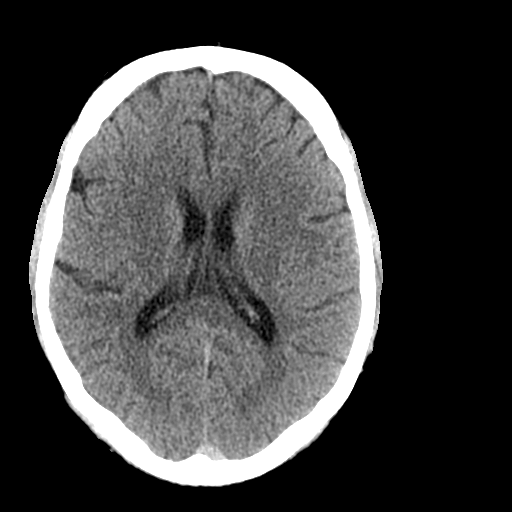
[im 17/33  bone]
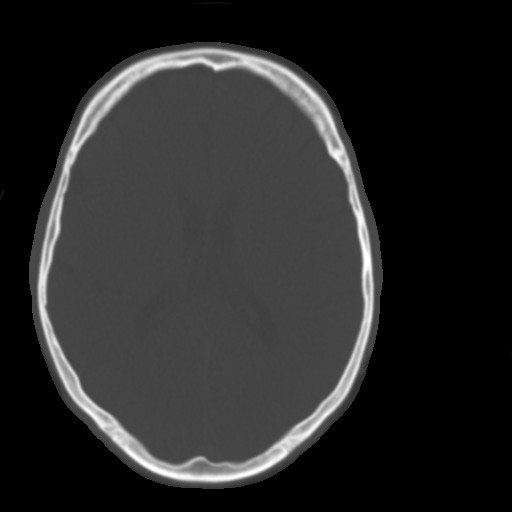
[im 19/33  brain]
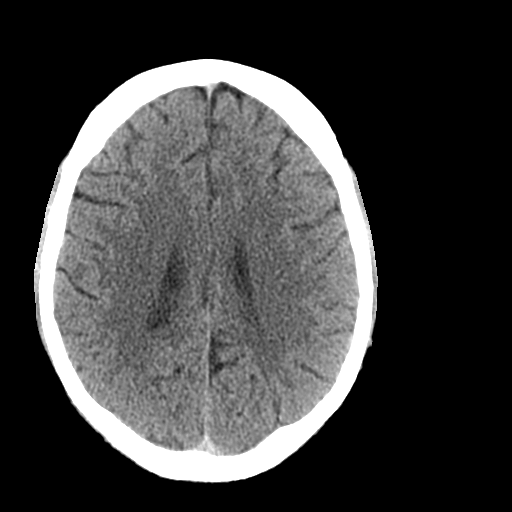
[im 23/33  brain]
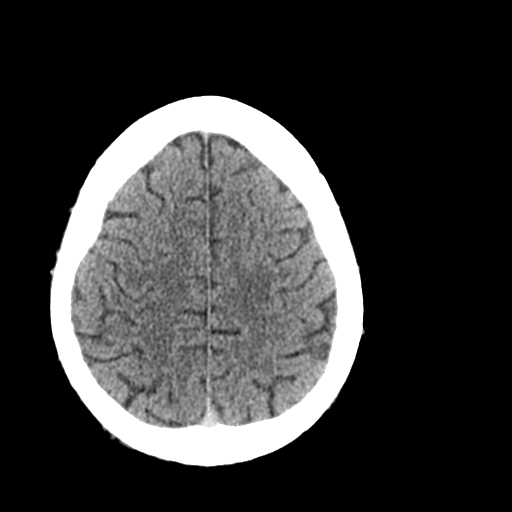
[im 26/33  brain]
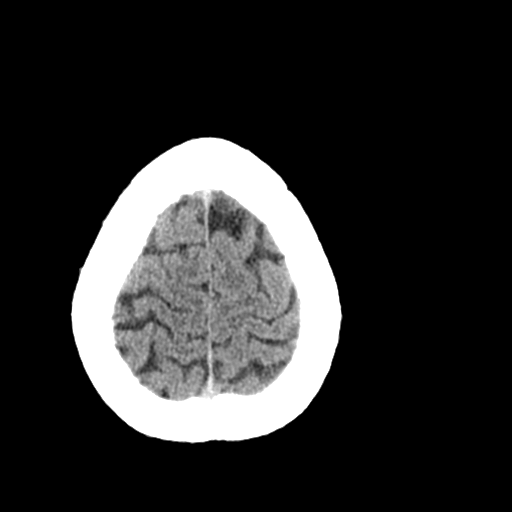
[im 30/33  brain]
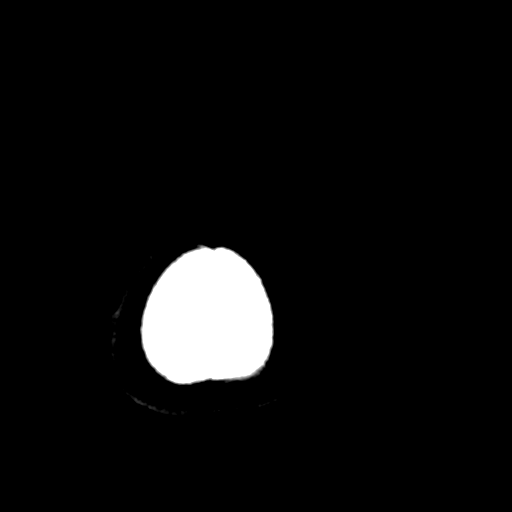
[im 30/33  bone]
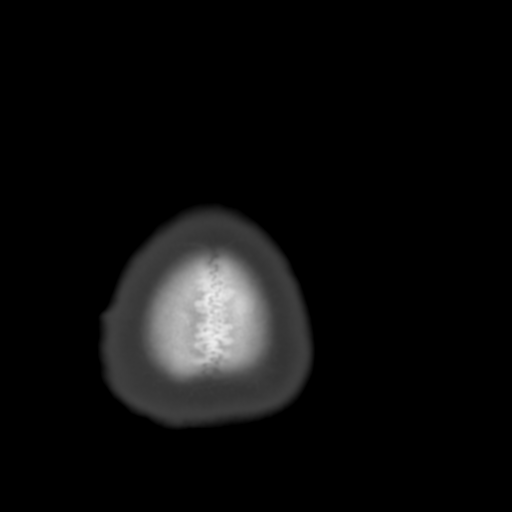

[Series 4: coronal soft · coronal · 0.32mm/px · 3 of 70 slices shown]
[im 24/70  brain]
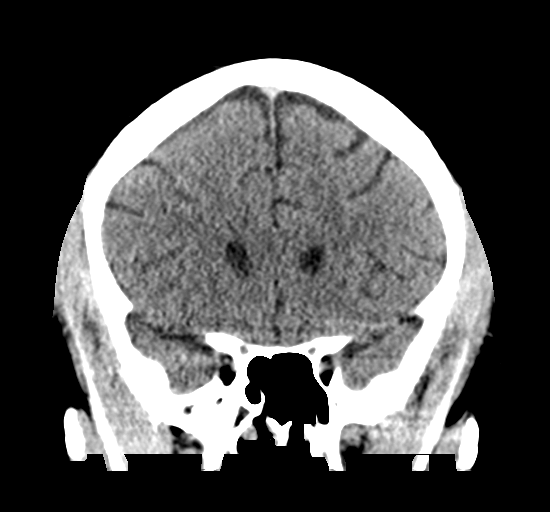
[im 31/70  brain]
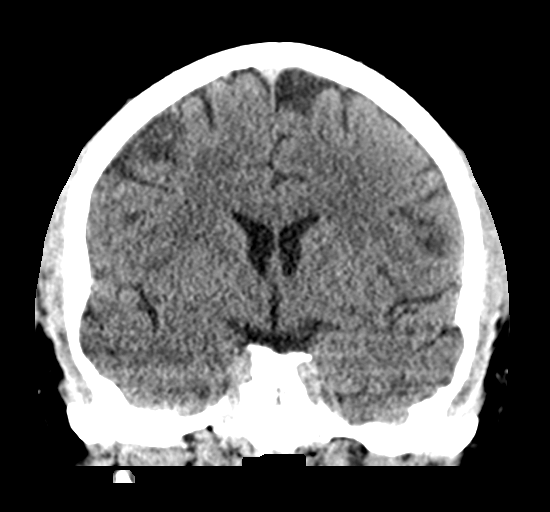
[im 39/70  brain]
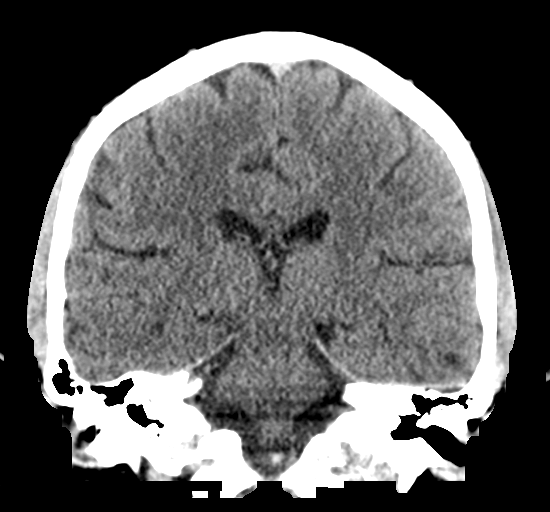

[Series 5: sagittal soft · sagittal · 0.34mm/px · 3 of 59 slices shown]
[im 20/59  brain]
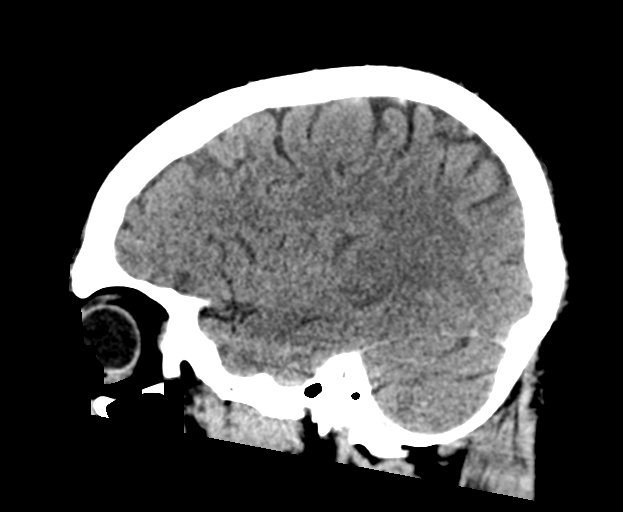
[im 30/59  brain]
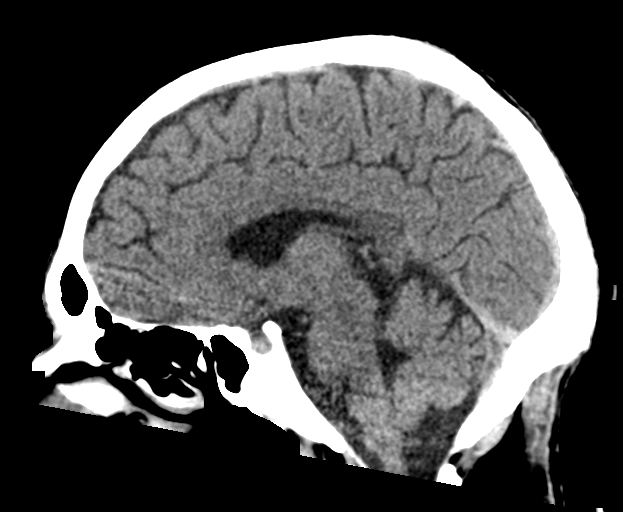
[im 39/59  brain]
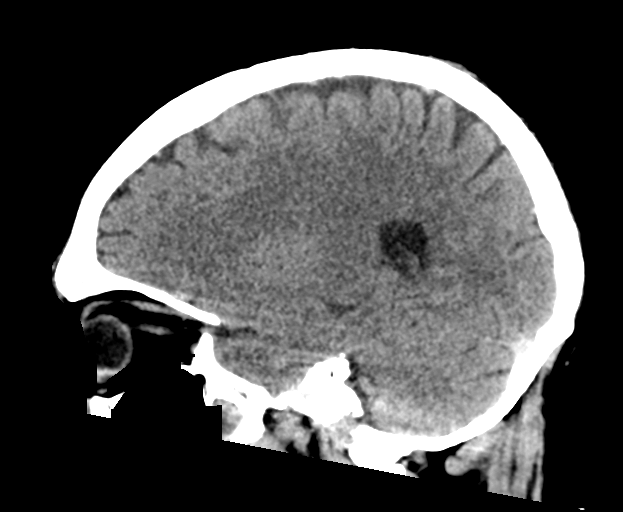

[15 of 47 positions shown; findings below may reference images not displayed]

FINDINGS: CT HEAD FINDINGS

Brain: There is no evidence of acute infarct, intracranial
hemorrhage, mass, midline shift, or extra-axial fluid collection.
The ventricles and sulci are normal.

Vascular: No hyperdense vessel.

Skull: No fracture or suspicious osseous lesion.

Sinuses/Orbits: Visualized paranasal sinuses are clear. Small left
mastoid effusion. Unremarkable orbits.

Other: None.

CT CERVICAL SPINE FINDINGS

Alignment: Slight reversal of the normal cervical lordosis. No
listhesis.

Skull base and vertebrae: No acute fracture or suspicious osseous
lesion.

Soft tissues and spinal canal: No prevertebral fluid or swelling. No
visible canal hematoma.

Disc levels:  Unremarkable.

Upper chest: Clear lung apices.

Other: None.
IMPRESSION: No evidence of acute intracranial or cervical spine injury.

## 2021-12-27 ENCOUNTER — Ambulatory Visit: Payer: Self-pay | Admitting: Internal Medicine

## 2022-11-30 ENCOUNTER — Ambulatory Visit
Admission: EM | Admit: 2022-11-30 | Discharge: 2022-11-30 | Disposition: A | Discharge status: Home / Self Care | Payer: Medicaid Other | Attending: Nurse Practitioner | Admitting: Nurse Practitioner

## 2022-11-30 DIAGNOSIS — K047 Periapical abscess without sinus: Secondary | ICD-10-CM

## 2022-11-30 DIAGNOSIS — I10 Essential (primary) hypertension: Secondary | ICD-10-CM | POA: Diagnosis not present

## 2022-11-30 MED ORDER — AMOXICILLIN 500 MG PO CAPS: 500.0000 mg | ORAL_CAPSULE | Freq: Two times a day (BID) | ORAL | 0 refills | Status: AC

## 2022-11-30 MED ORDER — AMLODIPINE BESYLATE 10 MG PO TABS: 10.0000 mg | ORAL_TABLET | Freq: Every day | ORAL | 0 refills | Status: DC

## 2022-11-30 NOTE — ED Provider Notes (Signed)
[facility]    CSN: [id number] Arrival date & time: 11/30/22  1125      History   Chief Complaint Chief Complaint  Patient presents with   Dental Pain    HPI Shawn Krueger is a 37 y.o. male.   HPI  He is in today for right sided dental pain. He reports that he has a bad wisdom tooth. This has been going for some time. He has as dental appt on Monday for removed. Denies fever, chills, headache, dizziness. Past Medical History:  Diagnosis Date   ETOH abuse    Hypertension    Kidney stone    Seizure ([place]) 11/22/2019    Patient Active Problem List   Diagnosis Date Noted   HTN (hypertension) 11/22/2019   Alcohol abuse 11/22/2019   Alcohol withdrawal seizure with complication, with unspecified complication ([place]) 0000000   Alcohol withdrawal ([place]) 11/22/2019    Past Surgical History:  Procedure Laterality Date   TONSILLECTOMY         Home Medications    Prior to Admission medications   Medication Sig Start Date End Date Taking? Authorizing Provider  amoxicillin (AMOXIL) 500 MG capsule Take 1 capsule (500 mg total) by mouth 2 (two) times daily for 7 days. 11/30/22 12/07/22 Yes Saphira Lahmann, Diona Foley, NP  amLODipine (NORVASC) 10 MG tablet Take 1 tablet (10 mg total) by mouth daily. 11/30/22   Vevelyn Francois, NP    Family History Family History  Problem Relation Age of Onset   Healthy Mother    Healthy Father     Social History Social History   Tobacco Use   Smoking status: Every Day    Packs/day: 1.00    Types: Cigarettes   Smokeless tobacco: Never  Substance Use Topics   Alcohol use: Yes    Comment: quit 5 days ago   Drug use: Not Currently    Types: Marijuana     Allergies   Patient has no known allergies.   Review of Systems Review of Systems   Physical Exam Triage Vital Signs ED Triage Vitals  Enc Vitals Group     BP 11/30/22 1145 (!) 155/103     Pulse Rate 11/30/22 1145 87     Resp 11/30/22 1145 18     Temp 11/30/22 1145  98.2 F (36.8 C)     Temp Source 11/30/22 1145 Oral     SpO2 11/30/22 1145 98 %     Weight --      Height --      Head Circumference --      Peak Flow --      Pain Score 11/30/22 1148 7     Pain Loc --      Pain Edu? --      Excl. in [place]? --    No data found.  Updated Vital Signs BP (!) 155/103 (BP Location: Right Arm)   Pulse 87   Temp 98.2 F (36.8 C) (Oral)   Resp 18   SpO2 98%   Visual Acuity Right Eye Distance:   Left Eye Distance:   Bilateral Distance:    Right Eye Near:   Left Eye Near:    Bilateral Near:     Physical Exam Constitutional:      General: He is not in acute distress. HENT:     Head: Normocephalic and atraumatic.     Nose: Nose normal.     Mouth/Throat:     Mouth: Mucous membranes are moist.  Comments: Mild swelling and white discoloration to gum and jaw Skin:    General: Skin is warm.     Capillary Refill: Capillary refill takes less than 2 seconds.  Neurological:     General: No focal deficit present.     Mental Status: He is alert.  Psychiatric:        Mood and Affect: Mood normal.        Behavior: Behavior normal.      UC Treatments / Results  Labs (all labs ordered are listed, but only abnormal results are displayed) Labs Reviewed - No data to display  EKG   Radiology No results found.  Procedures Procedures (including critical care time)  Medications Ordered in UC Medications - No data to display  Initial Impression / Assessment and Plan / UC Course  I have reviewed the triage vital signs and the nursing notes.  Pertinent labs & imaging results that were available during my care of the patient were reviewed by me and considered in my medical decision making (see chart for details).     Dental pain Final Clinical Impressions(s) / UC Diagnoses   Final diagnoses:  Dental abscess  Hypertension, unspecified type   Discharge Instructions   None    ED Prescriptions     Medication Sig Dispense Auth.  Provider   amLODipine (NORVASC) 10 MG tablet Take 1 tablet (10 mg total) by mouth daily. 30 tablet Dionisio David M, NP   amoxicillin (AMOXIL) 500 MG capsule Take 1 capsule (500 mg total) by mouth 2 (two) times daily for 7 days. 14 capsule Vevelyn Francois, NP      PDMP not reviewed this encounter.   Dionisio David [place], [place] 11/30/22 [phone]

## 2022-11-30 NOTE — ED Triage Notes (Signed)
Pt reports he thinks he has an infection in his right side wisdom tooth due to pain and some swelling in that area. Pt says he has a dentist appointment Monday.      Pt needs a refill on BP meds and help finding a primary .

## 2023-06-20 ENCOUNTER — Encounter (HOSPITAL_BASED_OUTPATIENT_CLINIC_OR_DEPARTMENT_OTHER): Payer: Self-pay | Admitting: Orthopedic Surgery

## 2023-06-20 ENCOUNTER — Other Ambulatory Visit: Payer: Self-pay

## 2023-06-28 ENCOUNTER — Other Ambulatory Visit: Payer: Self-pay

## 2023-06-28 ENCOUNTER — Encounter (HOSPITAL_BASED_OUTPATIENT_CLINIC_OR_DEPARTMENT_OTHER)
Admission: RE | Disposition: A | Discharge status: Home / Self Care | Payer: Self-pay | Source: Home / Self Care | Attending: Orthopedic Surgery

## 2023-06-28 ENCOUNTER — Ambulatory Visit (HOSPITAL_BASED_OUTPATIENT_CLINIC_OR_DEPARTMENT_OTHER)
Admission: RE | Admit: 2023-06-28 | Discharge: 2023-06-28 | Disposition: A | Discharge status: Home / Self Care | Payer: MEDICAID | Attending: Orthopedic Surgery | Admitting: Orthopedic Surgery

## 2023-06-28 ENCOUNTER — Encounter (HOSPITAL_BASED_OUTPATIENT_CLINIC_OR_DEPARTMENT_OTHER): Payer: Self-pay | Admitting: Orthopedic Surgery

## 2023-06-28 ENCOUNTER — Ambulatory Visit (HOSPITAL_BASED_OUTPATIENT_CLINIC_OR_DEPARTMENT_OTHER): Payer: MEDICAID | Admitting: Anesthesiology

## 2023-06-28 DIAGNOSIS — M24012 Loose body in left shoulder: Secondary | ICD-10-CM | POA: Insufficient documentation

## 2023-06-28 DIAGNOSIS — S43492A Other sprain of left shoulder joint, initial encounter: Secondary | ICD-10-CM | POA: Insufficient documentation

## 2023-06-28 DIAGNOSIS — S43432A Superior glenoid labrum lesion of left shoulder, initial encounter: Secondary | ICD-10-CM | POA: Diagnosis not present

## 2023-06-28 DIAGNOSIS — M25312 Other instability, left shoulder: Secondary | ICD-10-CM | POA: Insufficient documentation

## 2023-06-28 DIAGNOSIS — X58XXXA Exposure to other specified factors, initial encounter: Secondary | ICD-10-CM | POA: Insufficient documentation

## 2023-06-28 DIAGNOSIS — S42292A Other displaced fracture of upper end of left humerus, initial encounter for closed fracture: Secondary | ICD-10-CM | POA: Insufficient documentation

## 2023-06-28 DIAGNOSIS — F419 Anxiety disorder, unspecified: Secondary | ICD-10-CM | POA: Diagnosis not present

## 2023-06-28 DIAGNOSIS — I10 Essential (primary) hypertension: Secondary | ICD-10-CM | POA: Diagnosis not present

## 2023-06-28 DIAGNOSIS — F17210 Nicotine dependence, cigarettes, uncomplicated: Secondary | ICD-10-CM | POA: Insufficient documentation

## 2023-06-28 DIAGNOSIS — M65912 Unspecified synovitis and tenosynovitis, left shoulder: Secondary | ICD-10-CM | POA: Diagnosis not present

## 2023-06-28 HISTORY — PX: SHOULDER LATERJET: SHX6528

## 2023-06-28 HISTORY — DX: Anxiety disorder, unspecified: F41.9

## 2023-06-28 SURGERY — REPAIR, SHOULDER, LATARJET
Anesthesia: General | Site: Shoulder | Laterality: Left

## 2023-06-28 MED ORDER — DEXMEDETOMIDINE HCL IN NACL 80 MCG/20ML IV SOLN
INTRAVENOUS | Status: AC
  Filled 2023-06-28: qty 40

## 2023-06-28 MED ORDER — LACTATED RINGERS IV SOLN: INTRAVENOUS | Status: DC

## 2023-06-28 MED ORDER — CEFAZOLIN SODIUM-DEXTROSE 2-4 GM/100ML-% IV SOLN
2.0000 g | INTRAVENOUS | Status: AC
  Administered 2023-06-28: 2 g via INTRAVENOUS

## 2023-06-28 MED ORDER — PROPOFOL 10 MG/ML IV BOLUS
INTRAVENOUS | Status: DC | PRN
  Administered 2023-06-28: 200 mg via INTRAVENOUS

## 2023-06-28 MED ORDER — FENTANYL CITRATE (PF) 100 MCG/2ML IJ SOLN
INTRAMUSCULAR | Status: AC
  Filled 2023-06-28: qty 2

## 2023-06-28 MED ORDER — SODIUM CHLORIDE 0.9 % IR SOLN
Status: DC | PRN
Start: 2023-06-28 — End: 2023-06-28
  Administered 2023-06-28: 9000 mL

## 2023-06-28 MED ORDER — LIDOCAINE 2% (20 MG/ML) 5 ML SYRINGE
INTRAMUSCULAR | Status: DC | PRN
  Administered 2023-06-28: 40 mg via INTRAVENOUS

## 2023-06-28 MED ORDER — MIDAZOLAM HCL 2 MG/2ML IJ SOLN
INTRAMUSCULAR | Status: AC
  Filled 2023-06-28: qty 2

## 2023-06-28 MED ORDER — DEXMEDETOMIDINE HCL IN NACL 80 MCG/20ML IV SOLN
INTRAVENOUS | Status: DC | PRN
Start: 2023-06-28 — End: 2023-06-28
  Administered 2023-06-28: 4 ug via INTRAVENOUS
  Administered 2023-06-28: 8 ug via INTRAVENOUS

## 2023-06-28 MED ORDER — LIDOCAINE 2% (20 MG/ML) 5 ML SYRINGE
INTRAMUSCULAR | Status: AC
  Filled 2023-06-28: qty 5

## 2023-06-28 MED ORDER — MIDAZOLAM HCL 2 MG/2ML IJ SOLN
2.0000 mg | Freq: Once | INTRAMUSCULAR | Status: AC
  Administered 2023-06-28: 2 mg via INTRAVENOUS

## 2023-06-28 MED ORDER — BUPIVACAINE LIPOSOME 1.3 % IJ SUSP
INTRAMUSCULAR | Status: DC | PRN
Start: 2023-06-28 — End: 2023-06-28
  Administered 2023-06-28: 10 mL via PERINEURAL

## 2023-06-28 MED ORDER — SUGAMMADEX SODIUM 200 MG/2ML IV SOLN
INTRAVENOUS | Status: DC | PRN
  Administered 2023-06-28: 200 mg via INTRAVENOUS

## 2023-06-28 MED ORDER — ONDANSETRON HCL 4 MG PO TABS: 4.0000 mg | ORAL_TABLET | Freq: Three times a day (TID) | ORAL | 0 refills | Status: AC | PRN

## 2023-06-28 MED ORDER — EPINEPHRINE PF 1 MG/ML IJ SOLN
INTRAMUSCULAR | Status: AC
  Filled 2023-06-28: qty 2

## 2023-06-28 MED ORDER — PROPOFOL 10 MG/ML IV BOLUS
INTRAVENOUS | Status: AC
  Filled 2023-06-28: qty 20

## 2023-06-28 MED ORDER — ACETAMINOPHEN 500 MG PO TABS
ORAL_TABLET | ORAL | Status: AC
  Filled 2023-06-28: qty 2

## 2023-06-28 MED ORDER — ONDANSETRON HCL 4 MG/2ML IJ SOLN
INTRAMUSCULAR | Status: AC
  Filled 2023-06-28: qty 2

## 2023-06-28 MED ORDER — ONDANSETRON HCL 4 MG/2ML IJ SOLN
INTRAMUSCULAR | Status: DC | PRN
Start: 2023-06-28 — End: 2023-06-28
  Administered 2023-06-28: 4 mg via INTRAVENOUS

## 2023-06-28 MED ORDER — ROCURONIUM BROMIDE 10 MG/ML (PF) SYRINGE
PREFILLED_SYRINGE | INTRAVENOUS | Status: AC
  Filled 2023-06-28: qty 10

## 2023-06-28 MED ORDER — FENTANYL CITRATE (PF) 100 MCG/2ML IJ SOLN
INTRAMUSCULAR | Status: DC | PRN
  Administered 2023-06-28: 100 ug via INTRAVENOUS

## 2023-06-28 MED ORDER — ROCURONIUM BROMIDE 100 MG/10ML IV SOLN
INTRAVENOUS | Status: DC | PRN
  Administered 2023-06-28: 60 mg via INTRAVENOUS

## 2023-06-28 MED ORDER — HYDROMORPHONE HCL 1 MG/ML IJ SOLN: 0.2500 mg | INTRAMUSCULAR | Status: DC | PRN

## 2023-06-28 MED ORDER — CEFAZOLIN SODIUM-DEXTROSE 2-4 GM/100ML-% IV SOLN
INTRAVENOUS | Status: AC
  Filled 2023-06-28: qty 100

## 2023-06-28 MED ORDER — FENTANYL CITRATE (PF) 100 MCG/2ML IJ SOLN
100.0000 ug | Freq: Once | INTRAMUSCULAR | Status: AC
  Administered 2023-06-28: 100 ug via INTRAVENOUS

## 2023-06-28 MED ORDER — ACETAMINOPHEN 500 MG PO TABS
1000.0000 mg | ORAL_TABLET | Freq: Once | ORAL | Status: AC
  Administered 2023-06-28: 1000 mg via ORAL

## 2023-06-28 MED ORDER — OXYCODONE HCL 5 MG PO TABS
5.0000 mg | ORAL_TABLET | ORAL | 0 refills | Status: AC | PRN
Start: 2023-06-28 — End: 2024-06-27

## 2023-06-28 MED ORDER — ACETAMINOPHEN 120 MG RE SUPP
RECTAL | Status: AC
  Filled 2023-06-28: qty 1

## 2023-06-28 MED ORDER — DEXAMETHASONE SODIUM PHOSPHATE 10 MG/ML IJ SOLN
INTRAMUSCULAR | Status: AC
  Filled 2023-06-28: qty 1

## 2023-06-28 MED ORDER — BUPIVACAINE-EPINEPHRINE (PF) 0.5% -1:200000 IJ SOLN
INTRAMUSCULAR | Status: DC | PRN
  Administered 2023-06-28: 15 mL via PERINEURAL

## 2023-06-28 MED ORDER — SODIUM CHLORIDE 0.9 % IR SOLN
Status: DC | PRN
  Administered 2023-06-28: 6000 mL

## 2023-06-28 MED ORDER — BUPIVACAINE HCL (PF) 0.25 % IJ SOLN
INTRAMUSCULAR | Status: AC
  Filled 2023-06-28: qty 30

## 2023-06-28 MED ORDER — DEXAMETHASONE SODIUM PHOSPHATE 10 MG/ML IJ SOLN
INTRAMUSCULAR | Status: DC | PRN
  Administered 2023-06-28: 10 mg via INTRAVENOUS

## 2023-06-28 SURGICAL SUPPLY — 64 items
BLADE AVERAGE 25X9 (BLADE) IMPLANT
BLADE EXCALIBUR 4.0X13 (MISCELLANEOUS) IMPLANT
BLADE MICRO SAGITTAL (BLADE) ×1 IMPLANT
BLADE SHAVER TORPEDO 4X13 (MISCELLANEOUS) ×1 IMPLANT
BUR SURG 4D 13L RD FLUTE (BUR) IMPLANT
BURR OVAL 8 FLU 4.0X13 (MISCELLANEOUS) IMPLANT
BURR SURG 4D 13L RD FLUTE (BUR)
CANNULA 5.75X71 LONG (CANNULA) ×1 IMPLANT
CANNULA TWIST IN 8.25X7CM (CANNULA) IMPLANT
CLSR STERI-STRIP ANTIMIC 1/2X4 (GAUZE/BANDAGES/DRESSINGS) ×1 IMPLANT
COOLER ICEMAN CLASSIC (MISCELLANEOUS) ×1 IMPLANT
COVER BACK TABLE 60X90IN (DRAPES) IMPLANT
DRAPE INCISE IOBAN 66X45 STRL (DRAPES) ×1 IMPLANT
DRAPE STERI 35X30 U-POUCH (DRAPES) ×1 IMPLANT
DRAPE SURG 17X23 STRL (DRAPES) IMPLANT
DRAPE U-SHAPE 47X51 STRL (DRAPES) ×1 IMPLANT
DRAPE U-SHAPE 76X120 STRL (DRAPES) ×2 IMPLANT
DURAPREP 26ML APPLICATOR (WOUND CARE) ×1 IMPLANT
ELECT REM PT RETURN 9FT ADLT (ELECTROSURGICAL) ×1
ELECTRODE REM PT RTRN 9FT ADLT (ELECTROSURGICAL) IMPLANT
GAUZE PAD ABD 8X10 STRL (GAUZE/BANDAGES/DRESSINGS) ×1 IMPLANT
GAUZE SPONGE 4X4 12PLY STRL (GAUZE/BANDAGES/DRESSINGS) ×1 IMPLANT
GLOVE BIO SURGEON STRL SZ7.5 (GLOVE) ×2 IMPLANT
GLOVE BIOGEL PI IND STRL 7.0 (GLOVE) IMPLANT
GLOVE BIOGEL PI IND STRL 7.5 (GLOVE) IMPLANT
GLOVE BIOGEL PI IND STRL 8 (GLOVE) ×2 IMPLANT
GLOVE SURG SS PI 7.0 STRL IVOR (GLOVE) IMPLANT
GLOVE SURG SYN 8.0 (GLOVE) ×2
GLOVE SURG SYN 8.0 PF PI (GLOVE) IMPLANT
GOWN STRL REUS W/ TWL LRG LVL3 (GOWN DISPOSABLE) ×1 IMPLANT
GOWN STRL REUS W/ TWL XL LVL3 (GOWN DISPOSABLE) IMPLANT
GOWN STRL REUS W/TWL LRG LVL3 (GOWN DISPOSABLE) ×1
GOWN STRL REUS W/TWL XL LVL3 (GOWN DISPOSABLE) ×4 IMPLANT
GRAFT TIBIA DISTAL (Graft) IMPLANT
HANDPIECE INTERPULSE COAX TIP (DISPOSABLE) ×1
IMPL FIBERTAPE CIERCLAGE (Anchor) IMPLANT
IMPLANT FIBERTAPE CIERCLAGE (Anchor) ×1 IMPLANT
KIT PERC INSERT 3.0 KNTLS (KITS) IMPLANT
KIT STR SPEAR 1.8 FBRTK DISP (KITS) IMPLANT
LASSO 90 CVE QUICKPAS (DISPOSABLE) IMPLANT
LASSO CRESCENT QUICKPASS (SUTURE) IMPLANT
MANIFOLD NEPTUNE II (INSTRUMENTS) ×1 IMPLANT
NDL SAFETY ECLIP 18X1.5 (MISCELLANEOUS) ×1 IMPLANT
PACK ARTHROSCOPY DSU (CUSTOM PROCEDURE TRAY) ×1 IMPLANT
PACK BASIN DAY SURGERY FS (CUSTOM PROCEDURE TRAY) ×1 IMPLANT
PAD COLD SHLDR WRAP-ON (PAD) ×1 IMPLANT
PAD ORTHO SHOULDER 7X19 LRG (SOFTGOODS) IMPLANT
PENCIL SMOKE EVACUATOR (MISCELLANEOUS) IMPLANT
SET HNDPC FAN SPRY TIP SCT (DISPOSABLE) ×1 IMPLANT
SLEEVE ARM SUSPENSION SYSTEM (MISCELLANEOUS) ×1 IMPLANT
SLEEVE SCD COMPRESS KNEE MED (STOCKING) ×1 IMPLANT
SLING S3 LATERAL DISP (MISCELLANEOUS) ×1 IMPLANT
SLING ULTRA III MED (ORTHOPEDIC SUPPLIES) IMPLANT
SPIKE FLUID TRANSFER (MISCELLANEOUS) IMPLANT
SUT FIBERWIRE #2 38 T-5 BLUE (SUTURE)
SUT MNCRL AB 4-0 PS2 18 (SUTURE) ×1 IMPLANT
SUTURE FIBERWR #2 38 T-5 BLUE (SUTURE) IMPLANT
SUTURE TAPE TIGERLINK 1.3MM BL (SUTURE) IMPLANT
SUTURETAPE TIGERLINK 1.3MM BL (SUTURE)
SYR 5ML LL (SYRINGE) ×1 IMPLANT
TOWEL GREEN STERILE FF (TOWEL DISPOSABLE) ×1 IMPLANT
TUBE CONNECTING 20X1/4 (TUBING) IMPLANT
TUBING ARTHROSCOPY IRRIG 16FT (MISCELLANEOUS) ×1 IMPLANT
WAND ABLATOR APOLLO I90 (BUR) IMPLANT

## 2023-06-28 NOTE — Transfer of Care (Signed)
Immediate Anesthesia Transfer of Care Note  Patient: Shawn Krueger  Procedure(s) Performed: ARTHROSCOPY SHOULDER WITH GLENOID DISTAL TIBIAL ALLOGRAFT (Left: Shoulder)  Patient Location: PACU  Anesthesia Type:GA combined with regional for post-op pain  Level of Consciousness: drowsy  Airway & Oxygen Therapy: Patient Spontanous Breathing and Patient connected to face mask oxygen  Post-op Assessment: Report given to RN and Post -op Vital signs reviewed and stable  Post vital signs: Reviewed and stable  Last Vitals:  Vitals Value Taken Time  BP 154/84 06/28/23 1050  Temp    Pulse 76 06/28/23 1052  Resp 14 06/28/23 1052  SpO2 98 % 06/28/23 1052  Vitals shown include unfiled device data.  Last Pain:  Vitals:   06/28/23 0706  TempSrc: Temporal  PainSc: 0-No pain         Complications: No notable events documented.

## 2023-06-28 NOTE — Anesthesia Procedure Notes (Signed)
Anesthesia Regional Block: Interscalene brachial plexus block   Pre-Anesthetic Checklist: , timeout performed,  Correct Patient, Correct Site, Correct Laterality,  Correct Procedure, Correct Position, site marked,  Risks and benefits discussed,  Pre-op evaluation,  At surgeon's request and post-op pain management  Laterality: Left  Prep: Maximum Sterile Barrier Precautions used, chloraprep       Needles:  Injection technique: Single-shot  Needle Type: Echogenic Stimulator Needle     Needle Length: 5cm  Needle Gauge: 22     Additional Needles:   Procedures:,,,, ultrasound used (permanent image in chart),,    Narrative:  Start time: 06/28/2023 7:48 AM End time: 06/28/2023 7:58 AM Injection made incrementally with aspirations every 5 mL.  Performed by: Personally  Anesthesiologist: Gaynelle Adu, MD  Additional Notes: .

## 2023-06-28 NOTE — Discharge Instructions (Addendum)
Orthopedic surgery discharge instructions:  -Maintain postoperative bandages for 3 days.  You may remove these bandages on post op day 3 and begin showering at that time.  Please do not submerge underwater.  -Maintain your arm in sling at all times.  You should only remove for showering and getting dressed.  No lifting with the operative arm.  -For mild to moderate pain use Tylenol and Advil in alternating fashion around-the-clock.  For breakthrough pain use oxycodone as necessary.  -Please apply ice to the right shoulder for 20-30 minutes out of each hour that you are awake.  Do this around-the-clock for the first 3 days from surgery.  -Follow-up in 2 weeks for routine postoperative check.   Post Anesthesia Home Care Instructions  Activity: Get plenty of rest for the remainder of the day. A responsible individual must stay with you for 24 hours following the procedure.  For the next 24 hours, DO NOT: -Drive a car -Advertising copywriter -Drink alcoholic beverages -Take any medication unless instructed by your physician -Make any legal decisions or sign important papers.  Meals: Start with liquid foods such as gelatin or soup. Progress to regular foods as tolerated. Avoid greasy, spicy, heavy foods. If nausea and/or vomiting occur, drink only clear liquids until the nausea and/or vomiting subsides. Call your physician if vomiting continues.  Special Instructions/Symptoms: Your throat may feel dry or sore from the anesthesia or the breathing tube placed in your throat during surgery. If this causes discomfort, gargle with warm salt water. The discomfort should disappear within 24 hours.  May have Tylenol if needed after 3:30pm.     Regional Anesthesia Blocks  1. You may not be able to move or feel the "blocked" extremity after a regional anesthetic block. This may last may last from 3-48 hours after placement, but it will go away. The length of time depends on the medication injected and  your individual response to the medication. As the nerves start to wake up, you may experience tingling as the movement and feeling returns to your extremity. If the numbness and inability to move your extremity has not gone away after 48 hours, please call your surgeon.   2. The extremity that is blocked will need to be protected until the numbness is gone and the strength has returned. Because you cannot feel it, you will need to take extra care to avoid injury. Because it may be weak, you may have difficulty moving it or using it. You may not know what position it is in without looking at it while the block is in effect.  3. For blocks in the legs and feet, returning to weight bearing and walking needs to be done carefully. You will need to wait until the numbness is entirely gone and the strength has returned. You should be able to move your leg and foot normally before you try and bear weight or walk. You will need someone to be with you when you first try to ensure you do not fall and possibly risk injury.  4. Bruising and tenderness at the needle site are common side effects and will resolve in a few days.  5. Persistent numbness or new problems with movement should be communicated to the surgeon or the [facility] [phone] [facility] [phone]).DonJoy Ultrasling IV/Ultrasling IIIER:  Please contact your surgeon if you have questions or concerns about your sling.        Information for Discharge Teaching: EXPAREL (bupivacaine liposome injectable  suspension)   Pain relief is important to your recovery. The goal is to control your pain so you can move easier and return to your normal activities as soon as possible after your procedure. Your physician may use several types of medicines to manage pain, swelling, and more.  Your surgeon or anesthesiologist gave you EXPAREL(bupivacaine) to help control your pain after surgery.  EXPAREL is a local  anesthetic designed to release slowly over an extended period of time to provide pain relief by numbing the tissue around the surgical site. EXPAREL is designed to release pain medication over time and can control pain for up to 72 hours. Depending on how you respond to EXPAREL, you may require less pain medication during your recovery. EXPAREL can help reduce or eliminate the need for opioids during the first few days after surgery when pain relief is needed the most. EXPAREL is not an opioid and is not addictive. It does not cause sleepiness or sedation.   Important! A teal colored band has been placed on your arm with the date, time and amount of EXPAREL you have received. Please leave this armband in place for the full 96 hours following administration, and then you may remove the band. If you return to the hospital for any reason within 96 hours following the administration of EXPAREL, the armband provides important information that your health care providers to know, and alerts them that you have received this anesthetic.    Possible side effects of EXPAREL: Temporary loss of sensation or ability to move in the area where medication was injected. Nausea, vomiting, constipation Rarely, numbness and tingling in your mouth or lips, lightheadedness, or anxiety may occur. Call your doctor right away if you think you may be experiencing any of these sensations, or if you have other questions regarding possible side effects.  Follow all other discharge instructions given to you by your surgeon or nurse. Eat a healthy diet and drink plenty of water or other fluids.

## 2023-06-28 NOTE — Brief Op Note (Signed)
06/28/2023  10:48 AM  PATIENT:  Shawn Krueger  37 y.o. 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   Delay start of Pharmacological VTE agent (>24hrs) due to surgical blood loss or risk of bleeding: not applicable

## 2023-06-28 NOTE — Anesthesia Preprocedure Evaluation (Addendum)
Anesthesia Evaluation  Patient identified by MRN, date of birth, ID band Patient awake    Reviewed: Allergy & Precautions, H&P , NPO status , Patient's Chart, lab work & pertinent test results  Airway Mallampati: II  TM Distance: >3 FB Neck ROM: Full    Dental no notable dental hx. (+) Teeth Intact, Dental Advisory Given   Pulmonary Current Smoker   Pulmonary exam normal breath sounds clear to auscultation       Cardiovascular hypertension,  Rhythm:Regular Rate:Normal     Neuro/Psych   Anxiety     negative neurological ROS     GI/Hepatic negative GI ROS,,,(+)     substance abuse  alcohol use  Endo/Other  negative endocrine ROS    Renal/GU negative Renal ROS  negative genitourinary   Musculoskeletal   Abdominal   Peds  Hematology negative hematology ROS (+)   Anesthesia Other Findings   Reproductive/Obstetrics negative OB ROS                             Anesthesia Physical Anesthesia Plan  ASA: 2  Anesthesia Plan: General   Post-op Pain Management: Regional block*, Tylenol PO (pre-op)* and Precedex   Induction: Intravenous  PONV Risk Score and Plan: 2 and Ondansetron, Dexamethasone and Midazolam  Airway Management Planned: Oral ETT  Additional Equipment:   Intra-op Plan:   Post-operative Plan: Extubation in OR  Informed Consent: I have reviewed the patients History and Physical, chart, labs and discussed the procedure including the risks, benefits and alternatives for the proposed anesthesia with the patient or authorized representative who has indicated his/her understanding and acceptance.     Dental advisory given  Plan Discussed with: CRNA  Anesthesia Plan Comments:        Anesthesia Quick Evaluation

## 2023-06-28 NOTE — H&P (Signed)
ORTHOPAEDIC H&P  REQUESTING PHYSICIAN: Yolonda Kida, MD  PCP:  Patient, No Pcp Per  Chief Complaint: Left shoulder chronic instability  HPI: Shawn Krueger is a 37 y.o. male who complains of multiple episodes of anterior stability of the left shoulder.  Here today for arthroscopic assisted distal tibial allograft for bone block augmentation of the glenoid.  No new complaints.  Continues with symptoms consistent with the above.  Past Medical History:  Diagnosis Date   Anxiety    ETOH abuse    Hypertension    Kidney stone    Seizure ([facility]) 11/22/2019   happened when quit etoh use abruptly   Past Surgical History:  Procedure Laterality Date   TONSILLECTOMY     Social History   Socioeconomic History   Marital status: Single    Spouse name: Not on file   Number of children: Not on file   Years of education: Not on file   Highest education level: Not on file  Occupational History   Not on file  Tobacco Use   Smoking status: Every Day    Current packs/day: 0.25    Types: Cigarettes   Smokeless tobacco: Never  Vaping Use   Vaping status: Never Used  Substance and Sexual Activity   Alcohol use: Yes    Comment: Quit 11/2022   Drug use: Not Currently    Types: Marijuana   Sexual activity: Not on file  Other Topics Concern   Not on file  Social History Narrative   Not on file   Social Determinants of Health   Financial Resource Strain: Not on file  Food Insecurity: Not on file  Transportation Needs: Not on file  Physical Activity: Not on file  Stress: Not on file  Social Connections: Unknown (01/28/2022)   Received from [facility]   Social Network    Social Network: Not on file   Family History  Problem Relation Age of Onset   Healthy Mother    Healthy Father    No Known Allergies Prior to Admission medications   Medication Sig Start Date End Date Taking? Authorizing Provider  buPROPion ER (WELLBUTRIN SR) 100 MG 12 hr tablet Take  100 mg by mouth 2 (two) times daily.   Yes [provider]  hydrOXYzine (ATARAX) 50 MG tablet Take 50 mg by mouth 3 (three) times daily as needed.   Yes [provider]  Vitamin D, Ergocalciferol, (DRISDOL) 1.25 MG (50000 UNIT) CAPS capsule Take 50,000 Units by mouth every 7 (seven) days.   Yes [provider]   No results found.  Positive ROS: All other systems have been reviewed and were otherwise negative with the exception of those mentioned in the HPI and as above.  Physical Exam: General: Alert, no acute distress Cardiovascular: No pedal edema Respiratory: No cyanosis, no use of accessory musculature GI: No organomegaly, abdomen is soft and non-tender Skin: No lesions in the area of chief complaint Neurologic: Sensation intact distally Psychiatric: Patient is competent for consent with normal mood and affect Lymphatic: No axillary or cervical lymphadenopathy  MUSCULOSKELETAL: Left upper extremity is warm and well-perfused with no open wounds or lesions.  Assessment: 1.  Left chronic anterior shoulder instability  2.  Left shoulder glenoid bone loss  Plan: Plan to proceed today with arthroscopic assisted bone block augmentation with tibial allograft to the anterior glenoid.  We again discussed the risk of bleeding, infection, damage to surrounding nerves and vessels, stiffness, persistent pain and  instability, development of arthrosis, need for revision surgery, fracture as well as the risk of anesthesia.  He has provided informed consent.  Plan to discharge home postoperatively from PACU.    Yolonda Kida, MD Cell [phone]    06/28/2023 7:29 AM

## 2023-06-28 NOTE — Progress Notes (Signed)
Assisted Dr. Edmond Fitzgerald with left, interscalene , ultrasound guided block. Side rails up, monitors on throughout procedure. See vital signs in flow sheet. Tolerated Procedure well. 

## 2023-06-28 NOTE — Anesthesia Postprocedure Evaluation (Signed)
Anesthesia Post Note  Patient: Shawn Krueger  Procedure(s) Performed: ARTHROSCOPY SHOULDER WITH GLENOID DISTAL TIBIAL ALLOGRAFT (Left: Shoulder)     Patient location during evaluation: PACU Anesthesia Type: General and Regional Level of consciousness: awake and alert Pain management: pain level controlled Vital Signs Assessment: post-procedure vital signs reviewed and stable Respiratory status: spontaneous breathing, nonlabored ventilation and respiratory function stable Cardiovascular status: blood pressure returned to baseline and stable Postop Assessment: no apparent nausea or vomiting Anesthetic complications: no  No notable events documented.  Last Vitals:  Vitals:   06/28/23 1115 06/28/23 1130  BP: 129/83 (!) 145/98  Pulse: 76 68  Resp: 18 16  Temp:  (!) 36.2 C  SpO2: 95% 94%    Last Pain:  Vitals:   06/28/23 1130  TempSrc:   PainSc: 0-No pain                 Elis Sauber,W. EDMOND

## 2023-06-28 NOTE — Anesthesia Procedure Notes (Signed)
Procedure Name: Intubation Date/Time: 06/28/2023 8:46 AM  Performed by: Lauralyn Primes, CRNAPre-anesthesia Checklist: Patient identified, Emergency Drugs available, Suction available and Patient being monitored Patient Re-evaluated:Patient Re-evaluated prior to induction Oxygen Delivery Method: Circle system utilized Preoxygenation: Pre-oxygenation with 100% oxygen Induction Type: IV induction Ventilation: Mask ventilation without difficulty Laryngoscope Size: Mac and 4 Grade View: Grade I Tube type: Oral Tube size: 7.5 mm Number of attempts: 1 Airway Equipment and Method: Stylet, Oral airway and Bite block Placement Confirmation: ETT inserted through vocal cords under direct vision, positive ETCO2 and breath sounds checked- equal and bilateral Secured at: 23 cm Tube secured with: Tape Dental Injury: Teeth and Oropharynx as per pre-operative assessment

## 2023-06-28 NOTE — Op Note (Signed)
06/28/2023   PRE-OPERATIVE DIAGNOSIS:  Left shoulder chronic instability   POST-OPERATIVE DIAGNOSIS:   1.  Left shoulder chronic anterior instability 2.  Left shoulder pan labral tearing 3.  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  Left shoulder arthroscopic extensive debridement of rotator interval, superior labrum, posterior labrum as well as synovectomy 2.  Left shoulder arthroscopic anterior glenoid osteochondral allograft with fresh distal tibia allograft specimen   SURGEON:  Yolonda Kida, MD   PHYSICIAN ASSISTANT: Dion Saucier, PA-C   Assistant attestation: PA McClung present for the entire procedure.   ANESTHESIA:   General plus interscalene   ESTIMATED BLOOD LOSS: 50 cc   PREOPERATIVE INDICATIONS: Shawn Krueger is a very pleasant right-hand-dominant 37 year old gentleman who has had multiple anterior shoulder instability episodes.  Previously had no insurance and manage these in a closed fashion.  He is going on over the years to develop significant anterior-inferior glenoid bone loss consistent with with the above, as well as posterior humeral head chronic Hill-Sachs changes.  He has a off track glenohumeral joint and is here today for arthroscopic assisted anterior inferior glenoid bone block procedure with allograft.  The risks benefits and alternatives were discussed with the patient preoperatively including but not limited to the risks of infection, bleeding, nerve injury, cardiopulmonary complications, the need for revision surgery, among others, and the patient was willing to proceed.   OPERATIVE IMPLANTS: Arthrex cerclage tapes x 2. Fresh distal tibia osteochondral allograft x 1   OPERATIVE FINDINGS:  On diagnostic arthroscopy he did have intact rotator cuff musculature x 4.  There was significant scarring and chronic appearing synovitis in the anterior and anterior inferior capsular region.  The capsular volume appeared  normal in nature and did not appear to have any multidirectional instability.  He did have a type II SLAP tear at the biceps anchor the biceps itself was intact without any tearing or significant tenosynovitis.  Articular cartilage of the humeral head and glenoid were intact.  There was at least 30% glenoid bone loss in the anterior inferior position.  There was no appreciable anterior or inferior labral tissue.  Posterior labrum was intact.  There was a shallow broad posterior Hill-Sachs lesion of the humeral head that was off track.  1 small loose body was found that was less than 1 cm.   UNIQUE ASPECTS OF THE CASE:   Significant deficiency of the anterior labrum as well as inferior labrum.  Capsular injury noted as well anteriorly.  Preoperative examination did show an obvious grade 3 anterior load-and-shift with a grade 0 posterior load-and-shift and inferior load-and-shift with a negative sulcus.  No block to range of motion.   OPERATIVE PROCEDURE: The patient was brought to the operating room and placed in the supine position. General anesthesia was administered. IV antibiotics were given. General anesthesia was administered.    The upper extremity was examined and found to be grossly unstable particularly to anterior testing. The upper extremity was prepped and draped in the usual sterile fashion. The patient was in a semilateral decubitus position.  Time out was performed. Diagnostic arthroscopy was carried out the above-named findings.    I placed 2 anterior cannulas, one just off the superior boarder of the subscapularis and one in the superolateral aspect of the rotator interval utilizing spinal needle localization.  At this time we noted there was in fact significant anterior inferior glenoid bone loss consistent with preoperative studies.  There was  no labral tissue to speak of.  There was perhaps a thin rim that scarred along the medial neck of the glenoid but not sufficient enough to address  both the bone loss and the instability.  We then moved ahead with our extensive debridement of the joint via arthroscopic shaver.  We performed a wide resection of the rotator interval from the long head of the biceps over to the subscapularis.  We then moved to view anterior and work posteriorly and we likewise debrided inferior labrum, posterior labrum, and superior labrum with motorized shaver.   Next we began our preparation for the distal tibia allograft.  We prepared the glenoid for the bone graft to be received.  We used a bur as well as motorized shaver to create a perpendicular face along the medial aspect of the glenoid.  We then worked up to the anterior inferior aspect of the articular surface to make a nice smooth surface to receive the graft.  We then measured this with the arthroscopic tape measure.  This measured approximately 21 mm from inferior to superior and about 7 mm from posterior to anterior.   Next we introduced our posterior drill guide for our parallel tunnels across the glenoid and into the Bankart lesion.  An accessory incision was made posteriorly to receive the guide down to bone.  We then drilled 2 parallel pins across from posterior to anterior and approximately 7 mm medial to the articular face.  We used the Arthrex drill guide to do so.  This created our future tunnels for passage of cerclage tapes.   Next we left the shoulder removed to the back table to prepare the fresh distal tibia allograft.  Based on our intraoperative measurements we prepared the graft with the 7 mm width and 21 mm inferior to superior length and corresponding drill tunnels to receive the cerclage tapes.   We then moved back to the shoulder with our graft.  Prior to doing so we did Pulsavac irrigate the graft for 10 minutes to remove any bone marrow elements.   We then utilized passing sutures from posterior to anterior through the glenoid neck and out of the mid glenoid working portal.  The mid  glenoid portal was widened to receive the graft as well.  Care was taken to ensure sutures were all in the same plane.  We then were able to deliver the graft after passing the cerclage tapes through the graft and pulled this down to the native glenoid face.  We then tensioned both limbs of our 2 cerclage tapes from the posterior aspect with the tensioning device.  This brought the graft down to the anterior inferior glenoid with nice compression of the cancellous surface of bone.  We probed the graft and found it to be very secure.   Photos were taken throughout the procedure.   There was no remnant capsule or labrum remaining to secure back to the glenoid.  The glenohumeral joint was now nicely centered.   The arthroscopic cannulas were removed, and the portals closed with Monocryl followed by Steri-Strips and sterile gauze. The patient was awakened and returned to the PACU in stable and satisfactory condition. There were no complications and the patient tolerated the procedure well.  All counts were correct.   Disposition:  The patient will be nonweightbearing with a sling to the operative extremity.  He may begin scapular retractions and elbow hand and wrist range motion as tolerated.  He will begin physical therapy in 1  week.  I will see them back in the office in 2 weeks for a wound check.

## 2023-07-01 ENCOUNTER — Encounter (HOSPITAL_BASED_OUTPATIENT_CLINIC_OR_DEPARTMENT_OTHER): Payer: Self-pay | Admitting: Orthopedic Surgery

## 2024-10-12 ENCOUNTER — Ambulatory Visit
Admission: EM | Admit: 2024-10-12 | Discharge: 2024-10-12 | Disposition: A | Discharge status: Home / Self Care | Payer: Self-pay | Attending: Family Medicine | Admitting: Family Medicine

## 2024-10-12 DIAGNOSIS — J069 Acute upper respiratory infection, unspecified: Secondary | ICD-10-CM

## 2024-10-12 MED ORDER — AZELASTINE HCL 0.1 % NA SOLN: 1.0000 | Freq: Two times a day (BID) | NASAL | 0 refills | Status: AC

## 2024-10-12 MED ORDER — PROMETHAZINE-DM 6.25-15 MG/5ML PO SYRP: 5.0000 mL | ORAL_SOLUTION | Freq: Four times a day (QID) | ORAL | 0 refills | Status: AC | PRN

## 2024-10-12 NOTE — ED Provider Notes (Signed)
 " [facility]    CSN: [id number] Arrival date & time: 10/12/24  1143      History   Chief Complaint No chief complaint on file.   HPI Shawn Krueger is a 39 y.o. male.   Presenting today with 1 day history of nasal congestion, sore throat, cough, rhinorrhea.  Denies fever, chills, chest pain, shortness of breath, vomiting, diarrhea.  So far not trying anything over-the-counter for symptoms.  No known sick contacts recently.  No known pertinent chronic medical problems.    Past Medical History:  Diagnosis Date   Anxiety    ETOH abuse    Hypertension    Kidney stone    Seizure (HCC) 11/22/2019   happened when quit etoh use abruptly    Patient Active Problem List   Diagnosis Date Noted   HTN (hypertension) 11/22/2019   Alcohol abuse 11/22/2019   Alcohol withdrawal seizure with complication, with unspecified complication (HCC) 11/22/2019   Alcohol withdrawal (HCC) 11/22/2019    Past Surgical History:  Procedure Laterality Date   SHOULDER LATERJET Left 06/28/2023   Procedure: ARTHROSCOPY SHOULDER WITH GLENOID DISTAL TIBIAL ALLOGRAFT;  Surgeon: Sharl Selinda Dover, MD;  Location: [facility];  Service: Orthopedics;  Laterality: Left;   TONSILLECTOMY         Home Medications    Prior to Admission medications  Medication Sig Start Date End Date Taking? Authorizing Provider  azelastine  (ASTELIN ) 0.1 % nasal spray Place 1 spray into both nostrils 2 (two) times daily. Use in each nostril as directed 10/12/24  Yes Stuart Vernell Norris, PA-C  promethazine -dextromethorphan (PROMETHAZINE -DM) 6.25-15 MG/5ML syrup Take 5 mLs by mouth 4 (four) times daily as needed. 10/12/24  Yes Stuart Vernell Norris, PA-C  buPROPion ER (WELLBUTRIN SR) 100 MG 12 hr tablet Take 100 mg by mouth 2 (two) times daily.    [provider]  hydrOXYzine (ATARAX) 50 MG tablet Take 50 mg by mouth 3 (three) times daily as needed.    [provider]  ondansetron   (ZOFRAN ) 4 MG tablet Take 1 tablet (4 mg total) by mouth every 8 (eight) hours as needed for nausea or vomiting. 06/28/23   Sharl Selinda Dover, MD  Vitamin D, Ergocalciferol, (DRISDOL) 1.25 MG (50000 UNIT) CAPS capsule Take 50,000 Units by mouth every 7 (seven) days.    [provider]    Family History Family History  Problem Relation Age of Onset   Healthy Mother    Healthy Father     Social History Social History[1]   Allergies   Patient has no known allergies.   Review of Systems Review of Systems Per HPI  Physical Exam Triage Vital Signs ED Triage Vitals  Encounter Vitals Group     BP 10/12/24 1248 129/81     Girls Systolic BP Percentile --      Girls Diastolic BP Percentile --      Boys Systolic BP Percentile --      Boys Diastolic BP Percentile --      Pulse Rate 10/12/24 1248 78     Resp 10/12/24 1248 20     Temp 10/12/24 1248 98.6 F (37 C)     Temp Source 10/12/24 1248 Oral     SpO2 10/12/24 1248 98 %     Weight --      Height --      Head Circumference --      Peak Flow --      Pain Score 10/12/24 1250 0  Pain Loc --      Pain Education --      Exclude from Growth Chart --    No data found.  Updated Vital Signs BP 129/81 (BP Location: Right Arm)   Pulse 78   Temp 98.6 F (37 C) (Oral)   Resp 20   SpO2 98%   Visual Acuity Right Eye Distance:   Left Eye Distance:   Bilateral Distance:    Right Eye Near:   Left Eye Near:    Bilateral Near:     Physical Exam Vitals and nursing note reviewed.  Constitutional:      Appearance: Normal appearance. He is well-developed.  HENT:     Head: Atraumatic.     Right Ear: External ear normal.     Left Ear: External ear normal.     Nose: Rhinorrhea present.     Mouth/Throat:     Pharynx: Posterior oropharyngeal erythema present. No oropharyngeal exudate.  Eyes:     Extraocular Movements: Extraocular movements intact.     Conjunctiva/sclera: Conjunctivae normal.     Pupils: Pupils  are equal, round, and reactive to light.  Cardiovascular:     Rate and Rhythm: Normal rate and regular rhythm.  Pulmonary:     Effort: Pulmonary effort is normal. No respiratory distress.     Breath sounds: Normal breath sounds. No wheezing or rales.  Musculoskeletal:        General: Normal range of motion.     Cervical back: Normal range of motion and neck supple.  Lymphadenopathy:     Cervical: No cervical adenopathy.  Skin:    General: Skin is warm and dry.  Neurological:     General: No focal deficit present.     Mental Status: He is alert and oriented to person, place, and time.  Psychiatric:        Mood and Affect: Mood normal.        Behavior: Behavior normal.        Thought Content: Thought content normal.        Judgment: Judgment normal.    UC Treatments / Results  Labs (all labs ordered are listed, but only abnormal results are displayed) Labs Reviewed - No data to display  EKG   Radiology No results found.  Procedures Procedures (including critical care time)  Medications Ordered in UC Medications - No data to display  Initial Impression / Assessment and Plan / UC Course  I have reviewed the triage vital signs and the nursing notes.  Pertinent labs & imaging results that were available during my care of the patient were reviewed by me and considered in my medical decision making (see chart for details).     Vital signs and exam very reassuring today, suspect viral respiratory infection.  Will treat with Phenergan  DM, Astelin , supportive over-the-counter medications and home care.  Work note given.  Return for worsening or unresolving symptoms.  Final Clinical Impressions(s) / UC Diagnoses   Final diagnoses:  Viral URI with cough   Discharge Instructions   None    ED Prescriptions     Medication Sig Dispense Auth. Provider   promethazine -dextromethorphan (PROMETHAZINE -DM) 6.25-15 MG/5ML syrup Take 5 mLs by mouth 4 (four) times daily as needed.  100 mL Stuart Vernell Norris, PA-C   azelastine  (ASTELIN ) 0.1 % nasal spray Place 1 spray into both nostrils 2 (two) times daily. Use in each nostril as directed 30 mL Stuart Vernell Norris, PA-C      PDMP not  reviewed this encounter.    [1]  Social History Tobacco Use   Smoking status: Every Day    Current packs/day: 0.25    Types: Cigarettes   Smokeless tobacco: Never  Vaping Use   Vaping status: Never Used  Substance Use Topics   Alcohol use: Yes    Comment: Quit 11/2022   Drug use: Not Currently    Types: Marijuana     Stuart Vernell Norris, PA-C 10/12/24 1435  "

## 2024-10-12 NOTE — ED Triage Notes (Signed)
 Pt reports nasal congestion sore throat, cough, phlegm x 1 day. Pt declined testing during triage
